# Patient Record
Sex: Female | Born: 1960
Health system: Southern US, Community
[De-identification: ages and names within clinical notes are randomized; demographics above are authoritative.]

## PROBLEM LIST (undated history)

## (undated) DIAGNOSIS — K219 Gastro-esophageal reflux disease without esophagitis: Secondary | ICD-10-CM

## (undated) DIAGNOSIS — R911 Solitary pulmonary nodule: Secondary | ICD-10-CM

## (undated) DIAGNOSIS — G4730 Sleep apnea, unspecified: Secondary | ICD-10-CM

## (undated) DIAGNOSIS — R112 Nausea with vomiting, unspecified: Secondary | ICD-10-CM

## (undated) DIAGNOSIS — T8859XA Other complications of anesthesia, initial encounter: Secondary | ICD-10-CM

## (undated) DIAGNOSIS — K5790 Diverticulosis of intestine, part unspecified, without perforation or abscess without bleeding: Secondary | ICD-10-CM

## (undated) DIAGNOSIS — F419 Anxiety disorder, unspecified: Secondary | ICD-10-CM

## (undated) DIAGNOSIS — G4733 Obstructive sleep apnea (adult) (pediatric): Secondary | ICD-10-CM

## (undated) DIAGNOSIS — Z9889 Other specified postprocedural states: Secondary | ICD-10-CM

## (undated) DIAGNOSIS — M1990 Unspecified osteoarthritis, unspecified site: Secondary | ICD-10-CM

## (undated) DIAGNOSIS — D126 Benign neoplasm of colon, unspecified: Secondary | ICD-10-CM

## (undated) DIAGNOSIS — I493 Ventricular premature depolarization: Secondary | ICD-10-CM

## (undated) DIAGNOSIS — T4145XA Adverse effect of unspecified anesthetic, initial encounter: Secondary | ICD-10-CM

## (undated) DIAGNOSIS — K589 Irritable bowel syndrome without diarrhea: Secondary | ICD-10-CM

## (undated) DIAGNOSIS — D509 Iron deficiency anemia, unspecified: Secondary | ICD-10-CM

## (undated) DIAGNOSIS — F40240 Claustrophobia: Secondary | ICD-10-CM

## (undated) HISTORY — DX: Iron deficiency anemia, unspecified: D50.9

## (undated) HISTORY — DX: Other complications of anesthesia, initial encounter: T88.59XA

## (undated) HISTORY — DX: Benign neoplasm of colon, unspecified: D12.6

## (undated) HISTORY — DX: Irritable bowel syndrome, unspecified: K58.9

## (undated) HISTORY — DX: Ventricular premature depolarization: I49.3

## (undated) HISTORY — DX: Adverse effect of unspecified anesthetic, initial encounter: T41.45XA

## (undated) HISTORY — DX: Diverticulosis of intestine, part unspecified, without perforation or abscess without bleeding: K57.90

## (undated) HISTORY — DX: Anxiety disorder, unspecified: F41.9

## (undated) HISTORY — DX: Nausea with vomiting, unspecified: R11.2

## (undated) HISTORY — DX: Sleep apnea, unspecified: G47.30

## (undated) HISTORY — DX: Claustrophobia: F40.240

## (undated) HISTORY — DX: Gastro-esophageal reflux disease without esophagitis: K21.9

## (undated) HISTORY — DX: Solitary pulmonary nodule: R91.1

## (undated) HISTORY — DX: Other specified postprocedural states: Z98.890

## (undated) HISTORY — PX: TONSILLECTOMY: SUR1361

## (undated) HISTORY — DX: Obstructive sleep apnea (adult) (pediatric): G47.33

## (undated) HISTORY — PX: NASAL SEPTUM SURGERY: SHX37

---

## 1997-11-09 ENCOUNTER — Ambulatory Visit (HOSPITAL_COMMUNITY): Admission: RE | Admit: 1997-11-09 | Discharge: 1997-11-09 | Payer: Self-pay

## 1998-03-18 ENCOUNTER — Other Ambulatory Visit: Admission: RE | Admit: 1998-03-18 | Discharge: 1998-03-18 | Payer: Self-pay | Admitting: *Deleted

## 1999-07-31 ENCOUNTER — Other Ambulatory Visit: Admission: RE | Admit: 1999-07-31 | Discharge: 1999-07-31 | Payer: Self-pay | Admitting: Obstetrics and Gynecology

## 2000-11-10 ENCOUNTER — Other Ambulatory Visit: Admission: RE | Admit: 2000-11-10 | Discharge: 2000-11-10 | Payer: Self-pay | Admitting: Obstetrics and Gynecology

## 2001-11-11 ENCOUNTER — Other Ambulatory Visit: Admission: RE | Admit: 2001-11-11 | Discharge: 2001-11-11 | Payer: Self-pay | Admitting: Obstetrics and Gynecology

## 2002-05-23 ENCOUNTER — Encounter: Payer: Self-pay | Admitting: Obstetrics and Gynecology

## 2002-05-23 ENCOUNTER — Ambulatory Visit (HOSPITAL_COMMUNITY): Admission: RE | Admit: 2002-05-23 | Discharge: 2002-05-23 | Payer: Self-pay | Admitting: Obstetrics and Gynecology

## 2002-11-28 ENCOUNTER — Other Ambulatory Visit: Admission: RE | Admit: 2002-11-28 | Discharge: 2002-11-28 | Payer: Self-pay | Admitting: Obstetrics and Gynecology

## 2003-06-25 ENCOUNTER — Encounter: Admission: RE | Admit: 2003-06-25 | Discharge: 2003-06-25 | Payer: Self-pay | Admitting: Obstetrics and Gynecology

## 2003-06-28 ENCOUNTER — Encounter: Admission: RE | Admit: 2003-06-28 | Discharge: 2003-06-28 | Payer: Self-pay | Admitting: Obstetrics and Gynecology

## 2003-10-07 ENCOUNTER — Emergency Department (HOSPITAL_COMMUNITY): Admission: EM | Admit: 2003-10-07 | Discharge: 2003-10-07 | Payer: Self-pay | Admitting: Emergency Medicine

## 2003-11-23 ENCOUNTER — Other Ambulatory Visit: Admission: RE | Admit: 2003-11-23 | Discharge: 2003-11-23 | Payer: Self-pay | Admitting: Obstetrics and Gynecology

## 2003-12-01 ENCOUNTER — Emergency Department (HOSPITAL_COMMUNITY): Admission: EM | Admit: 2003-12-01 | Discharge: 2003-12-02 | Payer: Self-pay | Admitting: Emergency Medicine

## 2003-12-03 ENCOUNTER — Ambulatory Visit: Payer: Self-pay | Admitting: Internal Medicine

## 2004-01-13 HISTORY — PX: ANKLE SURGERY: SHX546

## 2004-03-22 ENCOUNTER — Encounter
Admission: RE | Admit: 2004-03-22 | Discharge: 2004-03-22 | Payer: Self-pay | Admitting: Physical Medicine and Rehabilitation

## 2004-05-09 ENCOUNTER — Ambulatory Visit: Payer: Self-pay | Admitting: Family Medicine

## 2004-07-03 ENCOUNTER — Observation Stay (HOSPITAL_COMMUNITY): Admission: RE | Admit: 2004-07-03 | Discharge: 2004-07-04 | Payer: Self-pay | Admitting: Orthopedic Surgery

## 2004-08-12 ENCOUNTER — Ambulatory Visit (HOSPITAL_COMMUNITY): Admission: RE | Admit: 2004-08-12 | Discharge: 2004-08-12 | Payer: Self-pay | Admitting: Obstetrics and Gynecology

## 2004-10-14 ENCOUNTER — Ambulatory Visit: Payer: Self-pay | Admitting: Gastroenterology

## 2005-01-14 ENCOUNTER — Other Ambulatory Visit: Admission: RE | Admit: 2005-01-14 | Discharge: 2005-01-14 | Payer: Self-pay | Admitting: Obstetrics and Gynecology

## 2005-02-12 DIAGNOSIS — G4733 Obstructive sleep apnea (adult) (pediatric): Secondary | ICD-10-CM

## 2005-02-12 HISTORY — DX: Obstructive sleep apnea (adult) (pediatric): G47.33

## 2005-02-20 ENCOUNTER — Ambulatory Visit (HOSPITAL_BASED_OUTPATIENT_CLINIC_OR_DEPARTMENT_OTHER): Admission: RE | Admit: 2005-02-20 | Discharge: 2005-02-20 | Payer: Self-pay | Admitting: Otolaryngology

## 2005-03-01 ENCOUNTER — Ambulatory Visit: Payer: Self-pay | Admitting: Internal Medicine

## 2005-07-10 ENCOUNTER — Encounter (INDEPENDENT_AMBULATORY_CARE_PROVIDER_SITE_OTHER): Payer: Self-pay | Admitting: Specialist

## 2005-07-10 ENCOUNTER — Ambulatory Visit (HOSPITAL_COMMUNITY): Admission: RE | Admit: 2005-07-10 | Discharge: 2005-07-10 | Payer: Self-pay | Admitting: Obstetrics and Gynecology

## 2005-08-31 ENCOUNTER — Ambulatory Visit (HOSPITAL_COMMUNITY): Admission: RE | Admit: 2005-08-31 | Discharge: 2005-08-31 | Payer: Self-pay | Admitting: Obstetrics and Gynecology

## 2005-11-02 ENCOUNTER — Ambulatory Visit: Payer: Self-pay | Admitting: Gastroenterology

## 2005-11-04 ENCOUNTER — Encounter: Admission: RE | Admit: 2005-11-04 | Discharge: 2005-11-04 | Payer: Self-pay | Admitting: Otolaryngology

## 2005-11-13 ENCOUNTER — Ambulatory Visit: Payer: Self-pay | Admitting: Internal Medicine

## 2005-11-18 ENCOUNTER — Ambulatory Visit (HOSPITAL_COMMUNITY): Admission: RE | Admit: 2005-11-18 | Discharge: 2005-11-18 | Payer: Self-pay | Admitting: Internal Medicine

## 2005-11-19 ENCOUNTER — Ambulatory Visit (HOSPITAL_COMMUNITY): Admission: RE | Admit: 2005-11-19 | Discharge: 2005-11-19 | Payer: Self-pay | Admitting: Internal Medicine

## 2005-12-01 ENCOUNTER — Ambulatory Visit: Payer: Self-pay | Admitting: Internal Medicine

## 2005-12-07 ENCOUNTER — Ambulatory Visit: Payer: Self-pay | Admitting: Gastroenterology

## 2006-09-17 ENCOUNTER — Ambulatory Visit (HOSPITAL_COMMUNITY): Admission: RE | Admit: 2006-09-17 | Discharge: 2006-09-17 | Payer: Self-pay | Admitting: Obstetrics and Gynecology

## 2007-07-08 ENCOUNTER — Telehealth: Payer: Self-pay | Admitting: Gastroenterology

## 2007-08-29 ENCOUNTER — Ambulatory Visit: Payer: Self-pay | Admitting: Gastroenterology

## 2007-08-29 DIAGNOSIS — K591 Functional diarrhea: Secondary | ICD-10-CM | POA: Insufficient documentation

## 2007-08-29 DIAGNOSIS — R198 Other specified symptoms and signs involving the digestive system and abdomen: Secondary | ICD-10-CM | POA: Insufficient documentation

## 2007-08-29 DIAGNOSIS — K589 Irritable bowel syndrome without diarrhea: Secondary | ICD-10-CM | POA: Insufficient documentation

## 2007-08-29 DIAGNOSIS — K219 Gastro-esophageal reflux disease without esophagitis: Secondary | ICD-10-CM | POA: Insufficient documentation

## 2007-08-29 LAB — CONVERTED CEMR LAB
Basophils Relative: 0.9 % (ref 0.0–3.0)
CO2: 30 meq/L (ref 19–32)
Eosinophils Relative: 3.1 % (ref 0.0–5.0)
GFR calc non Af Amer: 96 mL/min
Glucose, Bld: 99 mg/dL (ref 70–99)
Hemoglobin: 12.4 g/dL (ref 12.0–15.0)
Lymphocytes Relative: 25.5 % (ref 12.0–46.0)
Monocytes Absolute: 0.6 10*3/uL (ref 0.1–1.0)
Neutro Abs: 5.4 10*3/uL (ref 1.4–7.7)
Neutrophils Relative %: 63.9 % (ref 43.0–77.0)
Potassium: 4 meq/L (ref 3.5–5.1)
RBC: 4.46 M/uL (ref 3.87–5.11)
RDW: 13.1 % (ref 11.5–14.6)
Sodium: 140 meq/L (ref 135–145)
TSH: 2.45 microintl units/mL (ref 0.35–5.50)
Total Bilirubin: 0.7 mg/dL (ref 0.3–1.2)
Total Protein: 7.3 g/dL (ref 6.0–8.3)
WBC: 8.6 10*3/uL (ref 4.5–10.5)

## 2007-08-31 LAB — CONVERTED CEMR LAB: Tissue Transglutaminase Ab, IgA: 0.4 units (ref <7)

## 2007-09-02 ENCOUNTER — Telehealth: Payer: Self-pay | Admitting: Gastroenterology

## 2007-11-10 ENCOUNTER — Ambulatory Visit (HOSPITAL_COMMUNITY): Admission: RE | Admit: 2007-11-10 | Discharge: 2007-11-10 | Payer: Self-pay | Admitting: Orthopedic Surgery

## 2008-02-08 ENCOUNTER — Ambulatory Visit: Payer: Self-pay | Admitting: Gastroenterology

## 2008-02-08 ENCOUNTER — Telehealth: Payer: Self-pay | Admitting: Gastroenterology

## 2008-02-08 DIAGNOSIS — K644 Residual hemorrhoidal skin tags: Secondary | ICD-10-CM | POA: Insufficient documentation

## 2008-02-08 DIAGNOSIS — R197 Diarrhea, unspecified: Secondary | ICD-10-CM | POA: Insufficient documentation

## 2008-02-08 DIAGNOSIS — R109 Unspecified abdominal pain: Secondary | ICD-10-CM | POA: Insufficient documentation

## 2008-02-09 DIAGNOSIS — R142 Eructation: Secondary | ICD-10-CM

## 2008-02-09 DIAGNOSIS — R143 Flatulence: Secondary | ICD-10-CM

## 2008-02-09 DIAGNOSIS — R141 Gas pain: Secondary | ICD-10-CM | POA: Insufficient documentation

## 2008-02-13 ENCOUNTER — Telehealth: Payer: Self-pay | Admitting: Gastroenterology

## 2008-02-15 LAB — CONVERTED CEMR LAB
AST: 20 units/L (ref 0–37)
Bilirubin, Direct: 0.1 mg/dL (ref 0.0–0.3)
Ferritin: 16.1 ng/mL (ref 10.0–291.0)
HCT: 36.2 % (ref 36.0–46.0)
Iron: 44 ug/dL (ref 42–145)
Lymphocytes Relative: 22.7 % (ref 12.0–46.0)
MCHC: 33.8 g/dL (ref 30.0–36.0)
Monocytes Absolute: 0.7 10*3/uL (ref 0.1–1.0)
Monocytes Relative: 6.1 % (ref 3.0–12.0)
Neutrophils Relative %: 69.3 % (ref 43.0–77.0)
Platelets: 340 10*3/uL (ref 150–400)
Total Protein: 6.9 g/dL (ref 6.0–8.3)
WBC: 11.1 10*3/uL — ABNORMAL HIGH (ref 4.5–10.5)

## 2008-02-27 ENCOUNTER — Encounter: Admission: RE | Admit: 2008-02-27 | Discharge: 2008-02-27 | Payer: Self-pay | Admitting: Obstetrics and Gynecology

## 2008-02-28 ENCOUNTER — Ambulatory Visit: Payer: Self-pay | Admitting: Gastroenterology

## 2008-02-28 DIAGNOSIS — D509 Iron deficiency anemia, unspecified: Secondary | ICD-10-CM | POA: Insufficient documentation

## 2008-02-29 ENCOUNTER — Encounter: Admission: RE | Admit: 2008-02-29 | Discharge: 2008-02-29 | Payer: Self-pay | Admitting: Obstetrics and Gynecology

## 2008-11-15 ENCOUNTER — Telehealth: Payer: Self-pay | Admitting: Gastroenterology

## 2009-12-30 ENCOUNTER — Encounter
Admission: RE | Admit: 2009-12-30 | Discharge: 2009-12-30 | Payer: Self-pay | Source: Home / Self Care | Attending: Otolaryngology | Admitting: Otolaryngology

## 2010-01-20 ENCOUNTER — Telehealth: Payer: Self-pay | Admitting: Gastroenterology

## 2010-01-24 ENCOUNTER — Ambulatory Visit (HOSPITAL_COMMUNITY): Admission: RE | Admit: 2010-01-24 | Payer: Self-pay | Source: Home / Self Care | Admitting: Otolaryngology

## 2010-02-02 ENCOUNTER — Encounter: Payer: Self-pay | Admitting: Orthopedic Surgery

## 2010-02-02 ENCOUNTER — Encounter: Payer: Self-pay | Admitting: Obstetrics and Gynecology

## 2010-02-02 ENCOUNTER — Encounter: Payer: Self-pay | Admitting: Physical Medicine and Rehabilitation

## 2010-02-13 NOTE — Progress Notes (Signed)
Summary: Medcation  Medications Added ANUSOL-HC 25 MG SUPP (HYDROCORTISONE ACETATE) Use 1 suppository rectally at bedtime       Phone Note Call from Patient Call back at Home Phone [phone]   Caller: Patient Call For: Dr. Russella Dar Reason for Call: Talk to Nurse Summary of Call: Pt wants to discuss having a medcation called in again to her to the  [facility] on [address] in [place], it is a medication she used to use and would like to have it again Initial call taken by: [place] Johnson,  January 20, 2010 11:43 AM  Follow-up for Phone Call        Pt states she would like a suppository called into her pharmacy for her hemorrhoids. Pt states she would like the Anusol HC suppositories. She states she has some Intermittant BRB rectal bleeding but denies, itching, pain, change in bowels. Pt states she can feel the hemorrhoid outside of the rectum when she has a BM but states it does not hurt or feel irritated. Please advise if you want her to have follow-up visit first or can the rx be sent in? Follow-up by: Christie Nottingham CMA Duncan Dull),  January 20, 2010 11:56 AM  Additional Follow-up for Phone Call Additional follow up Details #1::        Can try a 7 day Anusol [facility] prescription and if symptoms are not completely resolved she needs an REV. Additional Follow-up by: Meryl Dare MD Clementeen Graham,  January 20, 2010 12:16 PM    Additional Follow-up for Phone Call Additional follow up Details #2::    Left message for patient  to call back. Follow-up by: Christie Nottingham CMA Duncan Dull),  January 20, 2010 1:45 PM  Additional Follow-up for Phone Call Additional follow up Details #3:: Details for Additional Follow-up Action Taken: Left message for patient  to call back. Left message for patient  to call back Christie Nottingham CMA [facility])  January 22, 2010 11:54 AM  Called work number and left a message stating that we will send in a 7 day prescription for Anusol to her pharmacy and after 7 days if her symptoms  have not resolved, to call our office back to schedule a REV.  Additional Follow-up by: Christie Nottingham CMA Duncan Dull),  January 21, 2010 12:08 PM  New/Updated Medications: ANUSOL-HC 25 MG SUPP (HYDROCORTISONE ACETATE) Use 1 suppository rectally at bedtime Prescriptions: ANUSOL-HC 25 MG SUPP (HYDROCORTISONE ACETATE) Use 1 suppository rectally at bedtime  #7 days x 0   Entered by:   Christie Nottingham CMA (AAMA)   Authorized by:   Meryl Dare MD [facility]   Signed by:   Christie Nottingham CMA (AAMA) on 01/22/2010   Method used:   Faxed to ...       [facility] (retail)       [address]       [place], [place]    [place]       Ph: [phone]       Fax: [phone]   RxID:   [id number]

## 2010-02-27 ENCOUNTER — Encounter (HOSPITAL_COMMUNITY)
Admission: RE | Admit: 2010-02-27 | Discharge: 2010-02-27 | Disposition: A | Discharge status: Home / Self Care | Payer: Federal, State, Local not specified - PPO | Source: Ambulatory Visit | Attending: Otolaryngology | Admitting: Otolaryngology

## 2010-02-27 ENCOUNTER — Other Ambulatory Visit (HOSPITAL_COMMUNITY): Payer: Self-pay

## 2010-02-27 DIAGNOSIS — Z01812 Encounter for preprocedural laboratory examination: Secondary | ICD-10-CM | POA: Insufficient documentation

## 2010-02-27 LAB — CBC
HCT: 40.1 % (ref 36.0–46.0)
Hemoglobin: 12.9 g/dL (ref 12.0–15.0)
MCHC: 32.2 g/dL (ref 30.0–36.0)
Platelets: 398 10*3/uL (ref 150–400)
RDW: 13.4 % (ref 11.5–15.5)

## 2010-02-27 LAB — SURGICAL PCR SCREEN: Staphylococcus aureus: POSITIVE — AB

## 2010-02-28 ENCOUNTER — Observation Stay (HOSPITAL_COMMUNITY)
Admission: RE | Admit: 2010-02-28 | Discharge: 2010-02-28 | Disposition: A | Discharge status: Home / Self Care | Payer: Federal, State, Local not specified - PPO | Source: Ambulatory Visit | Attending: Otolaryngology | Admitting: Otolaryngology

## 2010-02-28 DIAGNOSIS — G4733 Obstructive sleep apnea (adult) (pediatric): Secondary | ICD-10-CM | POA: Insufficient documentation

## 2010-02-28 DIAGNOSIS — J342 Deviated nasal septum: Principal | ICD-10-CM | POA: Insufficient documentation

## 2010-02-28 DIAGNOSIS — J45909 Unspecified asthma, uncomplicated: Secondary | ICD-10-CM | POA: Insufficient documentation

## 2010-02-28 DIAGNOSIS — J343 Hypertrophy of nasal turbinates: Secondary | ICD-10-CM | POA: Insufficient documentation

## 2010-02-28 DIAGNOSIS — Z01812 Encounter for preprocedural laboratory examination: Secondary | ICD-10-CM | POA: Insufficient documentation

## 2010-02-28 NOTE — Op Note (Signed)
NAME:  [name] Horn, [name] Horn             ACCOUNT NO.:  [id number]  MEDICAL RECORD NO.:  [id number]           PATIENT TYPE:  I  LOCATION:  2550                         FACILITY:  [facility]  PHYSICIAN:  Kinnie Scales. Annalee Genta, M.D.DATE OF BIRTH:  Jun 19, 1960  DATE OF PROCEDURE:  02/28/2010 DATE OF DISCHARGE:                              OPERATIVE REPORT   PREOPERATIVE DIAGNOSES: 1. Deviated nasal septum with nasal airway obstruction. 2. Inferior turbinate hypertrophy. 3. Obstructive sleep apnea.  POSTOPERATIVE DIAGNOSES: 1. Deviated nasal septum with nasal airway obstruction. 2. Inferior turbinate hypertrophy. 3. Obstructive sleep apnea.  INDICATIONS FOR SURGERY: 1. Deviated nasal septum with nasal airway obstruction. 2. Inferior turbinate hypertrophy. 3. Obstructive sleep apnea.  SURGICAL PROCEDURES: 1. Septoplasty. 2. Bilateral inferior turbinate reduction.  SURGEON:  Kinnie Scales. Annalee Genta, MD  ANESTHESIA:  General endotracheal.  COMPLICATIONS:  None.  BLOOD LOSS:  Less than 50 mL.  The patient was transferred to the operating room to the recovery room in stable condition.  BRIEF HISTORY:  The patient is a 50 year old female referred to our office with a history of progressive symptoms of nasal airway obstruction and mild obstructive sleep apnea.  Sleep study was performed which showed mild levels of apnea.  The patient was intolerant to CPAP because of nasal airway obstruction.  Given her history, examination, and findings, I recommended that we consider her for nasal septoplasty and inferior turbinate reduction.  The risks, benefits, and possible complications of the procedure were discussed in detail with the patient and her family.  They understood and concurred to our plan for surgery which is scheduled as an outpatient under general anesthesia with overnight observation at [facility].  PROCEDURE:  The patient was brought to the operating room and placed in supine  position on the operating table.  General endotracheal anesthesia was established without difficulty.  When the patient was adequately anesthetized, she was injected with a total of 8 mL of 1% lidocaine 1:100,000 solution of epinephrine which was injected in a submucosal fashion on the nasal septum and inferior turbinates bilaterally.  The patient's nose was then packed with Afrin-soaked cottonoid pledgets and left in place for approximately 10 minutes for vasoconstriction hemostasis.  She was positioned on the operating table and prepped and draped in a sterile fashion.  The patient's surgical procedure was begun with a right anterior hemitransfixion incision which was carried through the mucosa and underlying submucosa and a mucoperichondrial flap was elevated from anterior to posterior on the patient's right-hand side.  The cartilaginous septum was crossed at the midline.  Mucoperiosteal flap was elevated on the left.  Severe nasal septal deviation with bony septal spurring was noted.  Midseptal cartilage was removed and later morselized and returned to the mucoperichondrial pocket posteriorly. The patient had severely deviated septal bone and cartilage which were mobilized and resected bringing the nasal septum to a good midline position.  The septal cartilage returned to the mucoperichondrial pocket and 4-0 gut suture on a Keith needle was then used to reapproximate mucosal flaps.  At the occlusion of the procedure, bilateral Doyle nasal septal splints were placed after the application of Bactroban  ointment and sutured into position with a 3-0 Ethilon suture.  Inferior turbinate reduction was then performed with cautery set at 12 watts.  Two submucosal passes were made in each inferior turbinate. When the turbinates were adequately cauterized, an anterior incision was created overlying soft tissue and mucosa was elevated.  A small amount of turbinate bone was resected.  Turbinates  were then outfractured creating more patent nasal cavity.  There was no bleeding.  Nasal cavity and nasopharynx were irrigated and suctioned.  Orogastric tube was passed.  Stomach contents were aspirated.  The patient was awakened from anesthetic, extubated, and transferred from the operating room to the recovery room in stable condition.  There were no complications and blood loss was less than 50 mL.          ______________________________ Kinnie Scales. Annalee Genta, M.D.     DLS/MEDQ  D:  57/84/6962  T:  02/28/2010  Job:  [id number]  Electronically Signed by Osborn Coho M.D. on 02/28/2010 04:52:20 PM

## 2010-04-28 ENCOUNTER — Ambulatory Visit (INDEPENDENT_AMBULATORY_CARE_PROVIDER_SITE_OTHER): Payer: Federal, State, Local not specified - PPO | Admitting: Gastroenterology

## 2010-04-28 ENCOUNTER — Encounter: Payer: Self-pay | Admitting: Gastroenterology

## 2010-04-28 VITALS — BP 120/86 | HR 80 | Ht 63.0 in | Wt 181.6 lb

## 2010-04-28 DIAGNOSIS — Z8371 Family history of colonic polyps: Secondary | ICD-10-CM

## 2010-04-28 DIAGNOSIS — R1011 Right upper quadrant pain: Secondary | ICD-10-CM

## 2010-04-28 DIAGNOSIS — K648 Other hemorrhoids: Secondary | ICD-10-CM

## 2010-04-28 DIAGNOSIS — K589 Irritable bowel syndrome without diarrhea: Secondary | ICD-10-CM

## 2010-04-28 MED ORDER — PEG-KCL-NACL-NASULF-NA ASC-C 100 G PO SOLR: 1.0000 | Freq: Once | ORAL | Status: AC

## 2010-04-28 NOTE — Progress Notes (Signed)
History of Present Illness: This is a 50 year old female who complains of mild RUQ pain typically after a high fat meal for a few months. She describes a hot sensation with dull pain that resolves after minutes to hours. She has ongoing prolapsing of presumed hemorrhoids, needing manual reduction at times. One episode of small amounts of hematochezia. She has intermittent reflux symptoms and takes Nexium as needed. She no longer has diarrhea type IBS symptoms and she does not take LevBid. No change in stool caliber, melena or weight loss.  Current Medications, Allergies, Past Medical History, Past Surgical History, Family History and Social History were reviewed in [employer] record.  Physical Exam: General: Well developed , well nourished, no acute distress Head: Normocephalic and atraumatic Eyes:  sclerae anicteric, EOMI Ears: Normal auditory acuity Mouth: No deformity or lesions Lungs: Clear throughout to auscultation Heart: Regular rate and rhythm; no murmurs, rubs or bruits Abdomen: Soft, non tender and non distended. No masses, hepatosplenomegaly or hernias noted. Normal Bowel sounds Rectal: Small ext hem tag. No internal lesions. Heme negative brown stool. Musculoskeletal: Symmetrical with no gross deformities  Pulses:  Normal pulses noted Extremities: No clubbing, cyanosis, edema or deformities noted Neurological: Alert oriented x 4, grossly nonfocal Psychological:  Alert and cooperative. Anxious.  Assessment and Recommendations:  1. RUQ pain. Reduce fatty food intake. Abd [place] scheduled by Dr. Waynard Edwards for 4/17. If [place] negative and fatty food avoidance does not resolve the symptoms, would resume LevBid for possible IBS related pain.  2. Hemorrhoids. Cannot R/O a prolapsing polyp or other lesion but I suspect this internal hemorrhoids. Colonoscopy. See problem #3.   3. Family history of colon polyps in her mother and colon cancer in her MGM. The risks, benefits,  and alternatives to colonoscopy with possible biopsy and possible polypectomy and possible destruction of internal hemorrhoids were discussed with the patient and they consent to proceed.   4. GERD. Nexium prn.  5. Anxiety.

## 2010-04-28 NOTE — Patient Instructions (Signed)
You have been scheduled for a Colonoscopy. Separate instructions given to patient.  Your preperation kit has been sent to the pharmacy.

## 2010-05-13 ENCOUNTER — Encounter: Payer: Self-pay | Admitting: Gastroenterology

## 2010-05-13 ENCOUNTER — Telehealth: Payer: Self-pay | Admitting: Gastroenterology

## 2010-05-13 DIAGNOSIS — D126 Benign neoplasm of colon, unspecified: Secondary | ICD-10-CM

## 2010-05-13 HISTORY — DX: Benign neoplasm of colon, unspecified: D12.6

## 2010-05-13 NOTE — Telephone Encounter (Signed)
Pt states she was given a jug container with powder packets and didn't know why her instructions did not match her prescription. She stayed on the phone with me while we called the pharmacist and they told me to tell the patient to bring in the wrong containers she was given and they would exchange the prescription for Movi prep. [facility] does state they have Movi prep in stock so she can easily swing by after she leaves work to pick up the prep. Told pt to call us back if she has any problems picking up the right prep.

## 2010-05-14 ENCOUNTER — Ambulatory Visit (AMBULATORY_SURGERY_CENTER): Payer: Federal, State, Local not specified - PPO | Admitting: Gastroenterology

## 2010-05-14 ENCOUNTER — Encounter: Payer: Self-pay | Admitting: Gastroenterology

## 2010-05-14 VITALS — BP 166/87 | HR 114 | Temp 99.1°F | Resp 24 | Ht 63.0 in | Wt 179.0 lb

## 2010-05-14 DIAGNOSIS — D126 Benign neoplasm of colon, unspecified: Secondary | ICD-10-CM

## 2010-05-14 DIAGNOSIS — Z1211 Encounter for screening for malignant neoplasm of colon: Secondary | ICD-10-CM

## 2010-05-14 DIAGNOSIS — Z800 Family history of malignant neoplasm of digestive organs: Secondary | ICD-10-CM

## 2010-05-14 DIAGNOSIS — Z83719 Family history of colon polyps, unspecified: Secondary | ICD-10-CM

## 2010-05-14 DIAGNOSIS — K5730 Diverticulosis of large intestine without perforation or abscess without bleeding: Secondary | ICD-10-CM

## 2010-05-14 DIAGNOSIS — Z8371 Family history of colonic polyps: Secondary | ICD-10-CM

## 2010-05-14 MED ORDER — SODIUM CHLORIDE 0.9 % IV SOLN: 500.0000 mL | INTRAVENOUS | Status: DC

## 2010-05-14 NOTE — Patient Instructions (Signed)
Resume all medications. Information given on polyps, diverticulosis, high fiber diet. 

## 2010-05-15 ENCOUNTER — Telehealth: Payer: Self-pay

## 2010-05-15 ENCOUNTER — Telehealth: Payer: Self-pay | Admitting: Gastroenterology

## 2010-05-15 NOTE — Telephone Encounter (Signed)
1610 I called pt. Back, she questioned how she would receive the biopsy results.  I advised pt 1 6mm polyp was removed from the ascending colon ans it was sent to pathology to be tested.  Dr. Russella Dar will send her a letter in 2- 3 weeks with the results and any further recommendations.  Pt states she understands and that she did well after the colonoscopy at home.  No complaints.  No further questions at this time.

## 2010-05-15 NOTE — Telephone Encounter (Signed)
I called the pt's hm # and her cell #.  I reached voice mail on both, but no ID no message left.  MAW

## 2010-05-19 ENCOUNTER — Encounter: Payer: Self-pay | Admitting: Gastroenterology

## 2010-05-27 NOTE — Op Note (Signed)
NAME:  [name] Horn, [name] Horn             ACCOUNT NO.:  [id number]   MEDICAL RECORD NO.:  [id number]          PATIENT TYPE:  AMB   LOCATION:  SDS                          FACILITY:  [facility]   PHYSICIAN:  Vania Rea. Supple, M.D.  DATE OF BIRTH:  1960/07/14   DATE OF PROCEDURE:  11/10/2007  DATE OF DISCHARGE:                               OPERATIVE REPORT   PREOPERATIVE DIAGNOSIS:  Painful retained hardware, right ankle.   POSTOPERATIVE DIAGNOSIS:  Painful retained hardware, right ankle.   PROCEDURE:  Removal of a retained hardware, right ankle.   SURGEON:  Vania Rea. Supple, MD   ASSISTANT:  Lucita Lora. Shuford, PA-C   ANESTHESIA:  General endotracheal as well as local anesthetic.   TOURNIQUET TIME:  Less than 30 minutes.   ESTIMATED BLOOD LOSS:  Minimal.   DRAINS:  None.   HISTORY:  [name] is a 50 year old female who has had a previous  ORIF of her right ankle pilon fracture that I performed a number of  years ago.  She has gone on to clinical and radiographic healing with  continued to have discomfort related to her retained hardware.  Due to  her ongoing pain and functional limitation, she is brought to the  operating at this time for planned hardware removal from the right  ankle.   Preoperatively, I had counseled [name] on treatment options as  well as risks versus benefits thereof.  Possible surgical complications  bleeding, infection, neurovascular injury as well as persistent pain  reviewed.  She understands and accepts and agrees with our planned  procedure.   PROCEDURE IN DETAIL:  After undergoing routine preop evaluation, the  patient received prophylactic antibiotics.  Brought to the operating  room, placed supine on the operating table and underwent smooth  induction of general endotracheal anesthesia.  Turned by the right thigh  and right leg was sterilely prepped and draped in standard fashion.  Leg  was exsanguinated with the tourniquet inflated at 350 mmHg.   Made a  lateral approach to the distal fibula through her previous incision  approximately 7 cm in length.  Skin flaps were elevated.  Dissection  carried deeply down to the subcutaneous border of the distal fibula,  which immediately allowed visualization of the plate.  The peroneal  tendons were reflected posteriorly and the plate was then exposed in  subperiosteal fashion.  The screws were then removed without difficulty  as was the plate.  We then turned our direction medially where there had  been a single 4.5 screw placed through a stab wound and the screw head  was palpated.  A 1-cm skin incision was made with a knife and deep  dissection carried bluntly with a hemostat and the tip of the screw was  then visualized and a screwdriver introduced and the screw was removed  without difficulty.  Wound was then irrigated.  The lateral incision was  closed with 2-0 Vicryl for subcu layer and intracuticular 3-0 Monocryl  for the skin followed by Steri-Strips.  The medial incision was closed  with Monocryl and Steri-Strips.  A bulky dry dressing  was wrapped  about the ankle.  Prior to dressings, however, we did instill 0.5%  Marcaine with epinephrine into edges of the skin incisions.  A bulky dry  dressing was applied.  Tourniquet was let down.  The patient was then  awakened, extubated, and taken to the recovery room in stable condition.      Vania Rea. Supple, M.D.  Electronically Signed     KMS/MEDQ  D:  11/10/2007  T:  11/10/2007  Job:  [id number]

## 2010-05-29 ENCOUNTER — Telehealth: Payer: Self-pay | Admitting: Gastroenterology

## 2010-05-29 NOTE — Telephone Encounter (Signed)
All questions answered she will call back for any questions or concerns

## 2010-05-29 NOTE — Telephone Encounter (Signed)
Left message for patient to call back  

## 2010-05-30 NOTE — Procedures (Signed)
NAME:  [name] Horn, STERLING NO.:  [id number]   MEDICAL RECORD NO.:  [id number]          PATIENT TYPE:  OUT   LOCATION:  SLEEP CENTER                 FACILITY:  [facility]   PHYSICIAN:  Clinton D. Maple Hudson, M.D. DATE OF BIRTH:  1960/12/23   DATE OF STUDY:  02/20/2005                              NOCTURNAL POLYSOMNOGRAM   REFERRING PHYSICIAN:  Dr. Osborn Coho   DATE OF STUDY:  February 20, 2005   INDICATION FOR STUDY:  Hypersomnia with sleep apnea. Epworth sleepiness  score 6/24, BMI 30.9, weight 175 pounds. Home medication: Zyrtec.   SLEEP ARCHITECTURE:  Total sleep time 383 minutes with sleep efficiency 84%.  Stage I was 4%, stage II 50%, stages III and IV 17%, REM 28% of total sleep  time. Sleep latency 30 minutes, REM latency 97 minutes, awake after sleep  onset 44 minutes, arousal index increased at 45. No bedtime medication other  than Zyrtec was reported.   RESPIRATORY DATA:  Apnea-hypopnea index (AHI, RDI) 7.7 obstructive events  per hour indicating mild obstructive sleep apnea/hypopnea syndrome. This  included 9 obstructive apneas and 40 hypopneas. Most events were recorded  while sleeping on her back (AHI 18.9 per hour) with strong positional  association recognized. REM AHI 25 per hour. There were insufficient events  to permit CPAP titration by protocol on this study night and the diagnostic  study was performed.   OXYGEN DATA:  Moderate to severe snoring with oxygen desaturation to a nadir  of 82%. Mean oxygen saturation through the study was 95% on room air.   CARDIAC DATA:  Normal sinus rhythm.   MOVEMENT/PARASOMNIA:  Occasional leg jerk, insignificant.   IMPRESSION/RECOMMENDATION:  1.  Mild obstructive sleep apnea/hypopnea syndrome, apnea-hypopnea index 7.7      per hour, positional events mostly associated with supine sleep      position. Loud snoring with oxygen desaturation to a nadir of 82%.  2.  This score is below the range usually considered  for continuous positive      airway pressure titration unless more conservative measures are      insufficient. Evaluate for alternative therapies as appropriate.      Clinton D. Maple Hudson, M.D.  Diplomate, Biomedical engineer of Sleep Medicine  Electronically Signed     CDY/MEDQ  D:  03/01/2005 09:19:19  T:  03/01/2005 86:57:84  Job:  [id number]

## 2010-05-30 NOTE — Assessment & Plan Note (Signed)
[facility]                               PULMONARY OFFICE NOTE   NAME:[name] Horn, [name] Horn                    MRN:          [id number]  DATE:11/13/2005                            DOB:          1960/10/12    PROBLEM:  Pulmonary consultation at the kind request of Dr. Annalee Genta for  this 50 year old woman with a complaint of wheezing.   HISTORY:  She says she has not been specifically diagnosed with asthma, but  has a history of intermittent wheezing, head, and chest congestion going  back probably a number of years.  Something like 8-10 years ago, she had  allergy skin testing done by Dr. Sunnyslope Callas and remembers positive reactions for  animal danders, a variety of pollens, grass, and some foods.  Apparently  there were personality issues and also she found the logistics difficult and  did not follow through with allergy vaccine at that time.  She notices  wheezing specifically if she is exposed to cats or to grass.  An Albuterol  inhaler has helped in the past.  There has been a question of esophageal  reflux and she had a barium swallow and is working with Dr. Russella Dar.  She has  elevated the head of her bed and is pending upper endoscopy.  In the past  month, she has had what she recognized as a definite cold, starting with  head congestion moving to her throat.  She feels she always has mucous in  her throat with no complaints about her nose.  Dr. Annalee Genta had done  rhinoscopy and told her larynx was irritated.  She finds herself frequently  clearing her throat and says she feels her complaints and the wheeze  generate at the throat level.  She does not complain when asked about either  her sinuses or her chest as sources of these discomforts.   MEDICATIONS:  Multi-vitamins, Zegerid, Tylenol PM, Zyrtec.   ALLERGIES:  Drug intolerance of aspirin.   REVIEW OF SYSTEMS:  40 pounds weight gain that began when she stopped doing  regular exercise some years  ago.  Before that, she states she was quite  slender.  Exertional dyspnea blamed on weight gain only.  Occasional wheezes  wakes her.  She does not recognize waking at night, choking or strangling or  having had obvious reflux.  No significant headache, chest pain, or  palpitations.  No urticaria or other rash, adenopathy, hot joints, or edema.   PAST HISTORY:  Allergic rhinitis, question of sleep apnea with sleep study  done.  She has been fitted with CPAP but is not using it saying that she  cannot get used to it.  We will try to get that study report.  She had a  D&C in the spring of this year, surgical repair of right ankle fracture last  year, two C-sections.  She says she is not sure about any intolerance to  contrast dye, iodine, or to Afrin.   SOCIAL HISTORY:  She has never smoked.  1-2 beers a week.  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   FAMILY HISTORY:  Grandfather died young of tuberculosis.  Maternal  grandmother had colon cancer.  Father died with pneumonia while in a rehab  program.  Brother recently diagnosed with sarcoid.   OBJECTIVE:  VITAL SIGNS:  Weight 186 pounds, blood pressure 116/72, pulse  regular at 77, room air saturation 100%.  GENERAL:  This is an overweight, pleasant alert woman.  SKIN:  No rash.  ADENOPATHY:  None found.  HEAD AND NECK:  Periorbital edema, mild hoarseness, pharynx is not red, I do  not see evidence of post nasal drainage and there is no stridor or  thyromegaly.  No neck vein distention. The nasal septum is somewhat deviated  but not obstructing.  LUNGS:  Clear, I do not hear a wheeze.  HEART:  Sounds are regular without murmur or gallop.  ABDOMEN:  I do not feel liver or spleen.  EXTREMITIES:  Without cyanosis, clubbing, and edema.   IMPRESSION:  1. Asthma, probably mild and intermittent.  2. Allergic rhinitis, positive skin testing in the past.   3. Obstructive sleep apnea not successful at this point with CPAP.  I am      not clear where that stands.   PLAN:  1. We will get her sleep study report.  2. I have emphasized weight loss, good sleep hygiene, and reinforce Dr.      Ardell Isaacs anti-reflux instructions.  3. We are scheduling complete pulmonary function tests to include a      methacholine inhalation challenge.  4. She will return after those studies are complete.   I appreciate the chance to see her.     Clinton D. Maple Hudson, MD, Tonny Bollman, FACP  Electronically Signed    CDY/MedQ  DD: 11/14/2005  DT: 11/14/2005  Job #: [id number]   cc:   Onalee Hua L. Annalee Genta, M.D.  Lavonda Jumbo, M.D.

## 2010-05-30 NOTE — H&P (Signed)
NAME:  [name] Horn, [name] Horn NO.:  [id number]   MEDICAL RECORD NO.:  [id number]           PATIENT TYPE:   LOCATION:                                FACILITY:  [facility]   PHYSICIAN:  Zenaida Niece, M.D.     DATE OF BIRTH:   DATE OF ADMISSION:  07/10/2005  DATE OF DISCHARGE:                                HISTORY & PHYSICAL   CHIEF COMPLAINT:  Possible endometrial polyp.   HISTORY OF PRESENT ILLNESS:  This is a 50 year old white female para 2-0-0-2  who was seen in January of this year for an annual examination.  At that  time she said that she had regular periods every month which were lasting  for seven days, but not with significantly heavy flow.  In April of this  year she developed left pelvic pain.  This was intermittent, random, several  times a day, and lasted for approximately 10 seconds.  She had no  dyspareunia and no urinary or bowel symptoms.  The pain was mostly on her  left and occasionally on the right.  A pelvic ultrasound was performed on  April 10 which revealed a small, slightly irregular cyst of the right ovary  which I did not feel was responsible for her pain.  The tech also noted the  possibility of an endometrial polyp.  At that point I advised the patient to  just repeat the ultrasound in two to three months due to the slight  irregularity of the cyst.  However, she was very nervous about the fact that  this cyst was there and requested laparoscopic removal of the ovary.  She  did, however, then have an appointment at [facility] with the GYN  oncologists and they agreed with my assessment and did not feel that she  needed surgery.  She had a normal CA-125.  I then saw her back on May 24 for  a follow-up ultrasound with saline infusion to assess for a possible polyp.  At that time she has as normal sized uterus with possibly a slightly  thickened endometrium.  The right ovary is normal and the left ovary is not  seen.  With saline infusion there is  possibly an endometrial lesion in the  posterior mid fundus, but no definite polyp.  An endometrial biopsy was  performed which sounded to 8 cm, revealed moderate tissue and pathology  reveals proliferative endometrium and a fragment consistent with an  endometrial polyp.  Due to the fact that she has recently had some irregular  bleeding she wishes to proceed with hysteroscopy with removal of this polyp.  She also is considering having NovaSure endometrial ablation.   PAST OB HISTORY:  Significant for two cesarean sections at term.   PAST MEDICAL HISTORY:  Negative except for a fractured ankle in June of  2006.  She does have a history of eczema.   PAST SURGICAL HISTORY:  Tonsillectomy and cesarean section x2.   ALLERGIES:  Possibly to ASPIRIN.   CURRENT MEDICATIONS:  None.   SOCIAL HISTORY:  Patient is married and denies alcohol, tobacco, or drug  use.   FAMILY HISTORY:  Maternal grandmother has colon cancer.   Review of systems is otherwise negative.   PHYSICAL EXAMINATION:  VITAL SIGNS:  Weight is approximately 175 pounds,  blood pressure 130/80.  GENERAL:  This is a well-developed white female in no acute distress.  NECK:  Supple without lymphadenopathy or thyromegaly.  LUNGS:  Clear to auscultation.  HEART:  Regular rate and rhythm without murmur.  ABDOMEN:  Soft, nontender, nondistended with a well healed transverse scar.  EXTREMITIES:  No edema, nontender.  PELVIC:  External genitalia has no lesions.  On bimanual examination she has  a small plantar to anteverted uterus which is nontender.  She has no adnexal  masses and does not currently have tenderness.   ASSESSMENT:  Recent irregular bleeding with a possible endometrial polyp by  ultrasound and biopsy.  Patient wishes to proceed with hysteroscopy to  remove this polyp.  Risks of surgery have been discussed.  She has also been  offered and is considering having endometrial ablation done to decrease her  periods.   Again, all risks of surgery have been discussed.   PLAN:  Admit the patient on the day of surgery for a hysteroscopy with  dilation and curettage and possible resection.  She will let me know the day  of surgery if she wants to proceed with endometrial ablation as well.      Zenaida Niece, M.D.  Electronically Signed     TDM/MEDQ  D:  07/09/2005  T:  07/09/2005  Job:  [id number]

## 2010-05-30 NOTE — Op Note (Signed)
NAME:  [name] Horn, [name] Horn NO.:  [id number]   MEDICAL RECORD NO.:  [id number]          PATIENT TYPE:  AMB   LOCATION:  SDC                           FACILITY:  [facility]   PHYSICIAN:  Zenaida Niece, M.D.DATE OF BIRTH:  08-Jun-1960   DATE OF PROCEDURE:  07/10/2005  DATE OF DISCHARGE:                                 OPERATIVE REPORT   PREOPERATIVE DIAGNOSES:  Abnormal uterine bleeding.   POSTOPERATIVE DIAGNOSIS:  Abnormal uterine bleeding.   PROCEDURE:  Hysteroscopy with D&C.   SURGEON:  Zenaida Niece, M.D.   ANESTHESIA:  Monitored anesthesia care and paracervical block.   SPECIMENS:  Endometrial curettings sent to pathology.   ESTIMATED BLOOD LOSS:  Minimal.   COMPLICATIONS:  None.   FINDINGS:  She had an essentially normal endometrial cavity with abundant  tissue.  Small residual amount of tissue after curettage was attempted to be  resected, and a tiny bit of tissue was resected.  Fluid deficit through this  hysteroscope was approximately 66 mL.   PROCEDURE IN DETAIL:  The patient was taken to the operating room and placed  in the dorsal supine position.  She was given IV sedation and placed in  mobile stirrups.  Perineum and vagina were then prepped and draped in the  usual sterile fashion and bladder drained with a red rubber catheter.  A  Graves speculum was inserted into the vagina.  A 2 mL of 2% plain lidocaine  was infiltrated at 12 o'clock in the cervix, and this was grasped with a  single-tooth tenaculum.  Deep paracervical block was then performed with 16  mL of 2% lidocaine using 4 mL at 2, 4, 8 and 10 o'clock.  The uterus then  sounded to 9 cm, and the cervix was easily dilated to a size 19 dilator.  The observer hysteroscope was inserted, and good visualization was achieved.  Abundant tissue was noted and no specific lesions.  The hysteroscope was  removed, and sharp curettage was performed with return of a moderate amount  of tissue.  The  hysteroscope was reinserted, and there was still found to be  tissue anteriorly.  The hysteroscope was removed, and curettage was  performed again anteriorly.  The hysteroscope was again inserted, and almost  all of tissue was removed.  There was a small amount of tissue on the  posterior right side which appeared to be polypoid.  The small resectoscope  was put together with some difficulty and inserted.  It did not work very  smoothly, but a small amount of tissue was resected with cutting current  with a single loop.  The remainder of the uterus appeared free of  significant tissue.  The hysteroscope was removed.  The tenaculum was  removed from the anterior portion of the cervix and bleeding controlled with  pressure.  All instruments were then removed from the vagina.  The patient  tolerated the procedure well and was taken to the recovery room in stable  condition.  Counts were correct.      Zenaida Niece, M.D.  Electronically Signed  TDM/MEDQ  D:  07/10/2005  T:  07/10/2005  Job:  [id number]

## 2010-05-30 NOTE — Assessment & Plan Note (Signed)
[facility]                           GASTROENTEROLOGY OFFICE NOTE   NAME:Horn Horn VAZQUEZ                    MRN:          [id number]  DATE:11/02/2005                            DOB:          [birth year]    This is a return visit for possible LPR with GERD.  She has recently seen  Dr. Annalee Horn for symptoms of throat clearing and wheezing.  She has  occasional nocturnal symptoms.  She was advised to begin Zegerid 40 mg p.o.  q.d., which started about 10 days ago.  I last saw her in October of 2006  with similar symptoms.  She did not remain on medication and she did not  return for followup.  She has an appointment Dr. Fannie Horn next week to  evaluate wheezing, possible allergies and sleep apnea, on CPAP.  She would  like the appointment sooner.  She has no dysphagia, odynophagia or  heartburn.  She notes no weight loss, melena or hematochezia.  She does have  some occasional, very mild pain in her right upper quadrant that is not  clearly related to meals, bowel movements or any particular activity.  These  symptoms have been present for a few years.  She notes that they may be  slightly better since starting Zegerid 10 days ago.   CURRENT MEDICATIONS:  1. Zegerid 40 mg q.d. for 10 days.  2. Multivitamin q.d.  3. Zyrtec q.d. p.r.n.   MEDICATION ALLERGIES:  ASPIRIN.   EXAMINATION:  GENERAL:  In no acute distress.  VITAL SIGNS:  Weight 177 pounds.  Blood pressure is 100/70.  Pulse 68 and  regular.  HEENT:  Anicteric sclerae.  Oropharynx clear.  CHEST:  Clear to auscultation bilaterally.  CARDIAC:  Regular rate and rhythm without murmurs.  ABDOMEN:  Soft, non-tender and non-distended.  Normoactive bowel sounds.  No  palpable organomegaly, masses or hernias.   ASSESSMENT AND PLAN:  1. Presumed gastroesophageal reflux disease with lactate pyruvate ratio.      Increase Zegerid to 40 mg p.o. b.i.d. and begin strict anti-reflux      measures.   She will proceed with a barium esophagram already scheduled      by Dr. Annalee Horn.  May need upper endoscopy following this for further      evaluation.  In addition, her asthma symptoms may be related to      gastroesophageal reflux disease, or she may have allergies or other      causes, and this will be further evaluated by Dr. Fannie Horn.  We will      attempt to get a sooner appointment if Dr. Maple Horn has availability.      Begin strict anti-reflux measures with the use of 4 inch bed blocks.      Schedule upper endoscopy.  Risks, benefits and alternatives discussed      with the patient, and she consents to proceed.  This will be scheduled      electively.  I also discussed the possibility of dual-probe 24-hour pH      study performed off medication to further evaluate for possible  gastroesophageal reflux disease.  2. Family history of colon polyps and colon cancer.  Her mother has      adenomatous colon polyps in 36's, and she has a maternal grandmother      with colon cancer in her 51's.  Risks, benefits and alternatives to      colonoscopy with possible biopsy and possible polypectomy discussed      with the patient.  She consents to proceed.  This will be scheduled      electively at the time of her upper endoscopy.       [name] Lick. Russella Dar, MD, Clementeen Graham      MTS/MedQ  DD:  11/02/2005  DT:  11/03/2005  Job #:  [id number]   cc:   [name] Hua L. Horn Horn, M.D.

## 2010-05-30 NOTE — Assessment & Plan Note (Signed)
[facility]                               PULMONARY OFFICE NOTE   NAME:[name] Horn, [name] Horn                    MRN:          [id number]  DATE:12/01/2005                            DOB:          1960/07/01    PROBLEM:  1. Asthma.  2. Allergic rhinitis.  3. Obstructive sleep apnea, CPAP-intolerant.   HISTORY:  She had not picked up a Singulair sample.  Then called [place] asking  for an albuterol rescue inhaler, which we provided.  She says this has  helped quickly when she has had episodes of increased chest tightness and  shortness of breath.  She questions how much of it may be anxiety, but says  she is outdoors on windy days.  Occasional dull ache, left lateral posterior  ribs, which comes and goes.  She has a HEPA filter running in her room.  She  is not using CPAP.  She continues to want to strictly avoid any steroid  therapies.  Got a new puppy.  We discussed environmental precautions and  related issues.   MEDICATIONS:  1. Zegerid.  2. Tylenol PM.  3. Zyrtec.  4. Albuterol inhaler.   OBJECTIVE:  VITAL SIGNS:  Weight 179 pounds, blood pressure 102/60, pulse  regular at 79.  Room air saturation 98%.  HEART:  Sounds are regular and normal without murmur.  LUNGS:  Clear to P&A.  HEENT:  Conjunctivae and nasal mucosa are both clear.   Pulmonary function tests on November 19, 2005, showed normal spirometry  flows.  Her methacholine inhalation challenge test was abnormal, positive  for hyper-reactive airways which would support a suspicion of asthma.  Measured flow spirometries have been normal, without response to  bronchodilator.  Measured lung volumes and diffusion capacity have been  normal.   IMPRESSION:  1. Allergic rhinitis.  2. Mild intermittent asthma with diagnosis supported by her positive      methacholine inhalation challenge test and her subjective response to      albuterol.  3. Obstructive sleep apnea by history.  I do not have  full details, and      she does not recognize symptoms.   PLAN:  1. Chest x-ray.  2. Sample and trial of Singulair 10 mg.  3. Environmental precautions.  4. She will return for allergy skin testing.  5. Continue p.r.n. use of albuterol, as discussed.     Clinton D. Maple Hudson, MD, Tonny Bollman, FACP  Electronically Signed    CDY/MedQ  DD: 12/01/2005  DT: 12/02/2005  Job #: [id number]   cc:   Lavonda Jumbo, M.D.  Kinnie Scales. Annalee Genta, M.D.

## 2010-05-30 NOTE — Op Note (Signed)
NAME:  [name] Horn, [name] Horn NO.:  [id number]   MEDICAL RECORD NO.:  [id number]          PATIENT TYPE:  AMB   LOCATION:  DAY                          FACILITY:  [facility]   PHYSICIAN:  Vania Rea. Supple, M.D.  DATE OF BIRTH:  04/16/1960   DATE OF PROCEDURE:  07/03/2004  DATE OF DISCHARGE:                                 OPERATIVE REPORT   PREOPERATIVE DIAGNOSES:  Right ankle pilon fracture.   POSTOPERATIVE DIAGNOSES:  Right ankle pilon fracture.   PROCEDURE:  Open reduction and internal fixation of right ankle pilon  fracture including plating of the fibula and internal fixation of the distal  tibia.   SURGEON:  Vania Rea. Supple, M.D.   Threasa HeadsFrench Ana A. Shuford, P.A.-C.   ANESTHESIA:  General endotracheal.   TOURNIQUET TIME:  Less than 60 minutes.   ESTIMATED BLOOD LOSS:  Minimal.   DRAINS:  None.   HISTORY:  Ms. Shadle is a 50 year old female who slipped and fell injuring  her ankle approximately two weeks and on initial presentation to the office  the overall alignment of the fracture site was in relatively good position.  However on subsequent followup one week later, there had been displacement  at the fracture site with potential for displacement of the articular  segment. Due to the degree of displacement, it was discussed with Ms.  Robers and her husband surgical treatment options. The potential long-term  complications of surgical versus conservative management including potential  for malunion, nonunion, posttraumatic arthritis, failure of fixation,  infection, DVT, PE as well as persistence of pain were reviewed. She  understands and accepts and agrees with our plan for an ORIF.   DESCRIPTION OF PROCEDURE:  After undergoing routine preop evaluation, the  patient was brought to the operating room, placed supine on the operating  table and underwent smooth induction of general endotracheal anesthesia. She  received prophylactic antibiotics.  Tourniquet applied to the right thigh,  right leg was sterilely prepped and draped in standard fashion. The leg was  exsanguinated with the tourniquet inflated to 350 mmHg. A longitudinal 10 cm  incision was made over the distal fibula with sharp dissection carried  through the skin and subcutaneous tissue. The fascia was divided with the  perineal muscle and tendon reflected posteriorly and subperiosteal  dissection used to expose the posterolateral margin of the fibula from the  fracture site extending proximally and distally. The fracture was exposed,  irrigated and all __________ soft tissue and clot was removed. Under direct  visualization, an anatomic reduction was achieved. A seven hole 1/3 tubular  plate was then contoured to fit over the posterolateral margin of the fibula  and this was then fastened with standard AO technique with 3.5 cortical  screws proximally, 4.0 cancellous screws distally x2 and a lag screw across  the fracture site obliquely through the plate. This allowed excellent  compression at the fracture site and excellent alignment. Fl uroscopic  imaging was then used and the plate hardware all had good position. On  inspection of the distal tibial plafond, there was noted to be a sagittal  fracture line exiting at the superomedial margin of the tibial articular  surface. There was slight displacement and I was concerned about potential  further displacement. As such using percutaneous technique and fluoroscopic  guidance, a 4.5 cannulated screw was placed across this obtaining excellent  compression and closure of this fracture line and nicely stabilizing the  intraarticular segment of the pilon fracture. The more proximal metaphyseal  fracture lines were in overall excellent alignment. Fluoroscopic images were  then used to confirm proper positioning of all hardware and that the ankle  mortis was properly and congruently aligned and no evidence for  intraarticular  displaced fracture lines. The wounds were then copiously  irrigated. The lateral incision was closed with 2-0 Vicryl for the deep  fascia and subcu and intracuticular 3-0 Monocryl for the skin followed by  Steri-Strips. The medial stab wound was closed with a Monocryl and Steri-  Strips. Marcaine 0.5% with epinephrine was instilled about the skin  incisions. Topical antibiotic salve was used to coat the skin of the foot  and ankle but not over the incisions. A well padded stirrup splint with  ankle in neutral position was then applied. The tourniquet was let down. The  patient was then extubated and taken to the recovery room in stable  condition.       KMS/MEDQ  D:  07/03/2004  T:  07/03/2004  Job:  [id number]

## 2010-06-05 NOTE — Telephone Encounter (Signed)
Error

## 2010-10-13 LAB — CBC
HCT: 38
Hemoglobin: 12.6
MCV: 82.6
Platelets: 423 — ABNORMAL HIGH
WBC: 11.5 — ABNORMAL HIGH

## 2010-10-13 LAB — BASIC METABOLIC PANEL
BUN: 8
Chloride: 101
GFR calc non Af Amer: >60
Potassium: 4.2
Sodium: 138

## 2010-11-17 ENCOUNTER — Encounter: Payer: Self-pay | Admitting: Gastroenterology

## 2010-11-17 ENCOUNTER — Ambulatory Visit (INDEPENDENT_AMBULATORY_CARE_PROVIDER_SITE_OTHER): Payer: Federal, State, Local not specified - PPO | Admitting: Gastroenterology

## 2010-11-17 VITALS — BP 128/74 | HR 92 | Ht 63.0 in | Wt 188.0 lb

## 2010-11-17 DIAGNOSIS — K6289 Other specified diseases of anus and rectum: Secondary | ICD-10-CM

## 2010-11-17 MED ORDER — HYDROCORTISONE ACE-PRAMOXINE 2.5-1 % RE CREA: TOPICAL_CREAM | Freq: Two times a day (BID) | RECTAL | Status: AC

## 2010-11-17 MED ORDER — HYDROCORTISONE ACETATE 25 MG RE SUPP: 25.0000 mg | Freq: Every day | RECTAL | Status: DC

## 2010-11-17 NOTE — Progress Notes (Signed)
History of Present Illness: This is a 50 year old female with intermittent rectal pain for 6 months. She states with most bowel movements she will have rectal discomfort for 5-10 minutes following a bowel movement. She occasionally has straining but generally has no difficulties with bowel movements. She underwent colonoscopy in May 2012 with findings of small external and small internal hemorrhoids as well as adenomatous colon polyps. She has not taken any treatment for her hemorrhoids. Denies weight loss, abdominal pain, constipation, diarrhea, change in stool caliber, melena, hematochezia, nausea, vomiting, dysphagia, reflux symptoms, chest pain.  Current Medications, Allergies, Past Medical History, Past Surgical History, Family History and Social History were reviewed in [employer] record.  Physical Exam: General: Well developed , well nourished, no acute distress Head: Normocephalic and atraumatic Eyes:  sclerae anicteric, EOMI Ears: Normal auditory acuity Mouth: No deformity or lesions Lungs: Clear throughout to auscultation Heart: Regular rate and rhythm; no murmurs, rubs or bruits Abdomen: Soft, non tender and non distended. No masses, hepatosplenomegaly or hernias noted. Normal Bowel sounds Rectal: Small external hemorrhoidal tags, no internal lesions, no fissures, no tenderness, Hemoccult negative brown stool in the vault Musculoskeletal: Symmetrical with no gross deformities  Pulses:  Normal pulses noted Extremities: No clubbing, cyanosis, edema or deformities noted Neurological: Alert oriented x 4, grossly nonfocal Psychological:  Alert and cooperative. Normal mood and affect  Assessment and Recommendations:  1. Anal and rectal pain following defecation. Colonoscopy performed May 2012 showed small internal hemorrhoids. Exam today is only remarkable for small external hemorrhoidal tags. I do not see evidence of anal fissure and there is no tenderness. Plan to  treat hemorrhoids with a stool softener, Analpram cream topcially, Anusol-HC suppositories and standard rectal care measures. If symptoms persist consider empiric treatment for possible anal fissure.  2. Personal history of adenomatous colon polyps. Surveillance colonoscopy recommended May 2017.

## 2010-11-17 NOTE — Patient Instructions (Signed)
Patient given rectal care instructions. Use Analpram cream rectally twice daily. Samples provided for patient. Use Anusol HC suppositories one rectally at bedtime. A prescription has been sent to your pharmacy.  cc: Rodrigo Ran, MD

## 2010-12-30 ENCOUNTER — Encounter (HOSPITAL_BASED_OUTPATIENT_CLINIC_OR_DEPARTMENT_OTHER): Payer: Federal, State, Local not specified - PPO

## 2011-03-24 ENCOUNTER — Other Ambulatory Visit (HOSPITAL_COMMUNITY): Payer: Self-pay | Admitting: Obstetrics and Gynecology

## 2011-03-24 DIAGNOSIS — Z1231 Encounter for screening mammogram for malignant neoplasm of breast: Secondary | ICD-10-CM

## 2011-03-27 ENCOUNTER — Telehealth: Payer: Self-pay | Admitting: Gastroenterology

## 2011-03-27 NOTE — Telephone Encounter (Signed)
Patient having abdominal cramping and pain.  She reports she feels bloated and having pain like gas. I have given her some Align and Restora samples and asked her to try theses.  She is also asked to try gas x or phazyme and an anti-gas diet.  She will come in and see Dr Russella Dar on 04/01/11

## 2011-04-01 ENCOUNTER — Ambulatory Visit (INDEPENDENT_AMBULATORY_CARE_PROVIDER_SITE_OTHER): Payer: Federal, State, Local not specified - PPO | Admitting: Gastroenterology

## 2011-04-01 ENCOUNTER — Encounter: Payer: Self-pay | Admitting: Gastroenterology

## 2011-04-01 ENCOUNTER — Other Ambulatory Visit (INDEPENDENT_AMBULATORY_CARE_PROVIDER_SITE_OTHER): Payer: Federal, State, Local not specified - PPO

## 2011-04-01 VITALS — BP 118/90 | HR 84 | Ht 63.0 in | Wt 190.4 lb

## 2011-04-01 DIAGNOSIS — R1011 Right upper quadrant pain: Secondary | ICD-10-CM

## 2011-04-01 DIAGNOSIS — R198 Other specified symptoms and signs involving the digestive system and abdomen: Secondary | ICD-10-CM

## 2011-04-01 LAB — CBC WITH DIFFERENTIAL/PLATELET
Basophils Relative: 0.5 % (ref 0.0–3.0)
Hemoglobin: 13.3 g/dL (ref 12.0–15.0)
Lymphocytes Relative: 29.5 % (ref 12.0–46.0)
MCHC: 33.1 g/dL (ref 30.0–36.0)
Monocytes Relative: 5.8 % (ref 3.0–12.0)
Neutro Abs: 4.8 10*3/uL (ref 1.4–7.7)
RBC: 4.82 Mil/uL (ref 3.87–5.11)

## 2011-04-01 LAB — COMPREHENSIVE METABOLIC PANEL
AST: 22 U/L (ref 0–37)
BUN: 14 mg/dL (ref 6–23)
Calcium: 9.3 mg/dL (ref 8.4–10.5)
Chloride: 98 mEq/L (ref 96–112)
Creatinine, Ser: 0.7 mg/dL (ref 0.4–1.2)
GFR: 95.87 mL/min (ref >60.00)

## 2011-04-01 MED ORDER — GLYCOPYRROLATE 1 MG PO TABS: 1.0000 mg | ORAL_TABLET | Freq: Two times a day (BID) | ORAL | Status: DC

## 2011-04-01 MED ORDER — PANTOPRAZOLE SODIUM 40 MG PO TBEC: 40.0000 mg | DELAYED_RELEASE_TABLET | Freq: Every day | ORAL | Status: DC

## 2011-04-01 NOTE — Patient Instructions (Addendum)
Your physician has requested that you go to the basement for the following lab work before leaving today: Celiac panel, CMET, CBC, Lipase. We have sent the following medications to your pharmacy for you to pick up at your convenience:Pantoprazole, Robinul. Please schedule a f/u appt to see Dr. Russella Dar in 2-3 weeks. cc: Rodrigo Ran, MD

## 2011-04-01 NOTE — Progress Notes (Signed)
History of Present Illness: This is a 51 year old female who relates several weeks of burning, uncomfortable right upper quadrant abdominal pain associated with bloating. She does have a slight variation in her bowel habits with occasional mild constipation. She is concerned about celiac disease. Colonoscopy in May 2012 showed a 4 mm adenomatous polyp but was otherwise negative. Upper endoscopy in 2007 showed a small hiatal hernia and was otherwise negative. She had a tissue transglutaminase that was negative in 2009. She states she had an abdominal ultrasound  performed last summer that was negative. Her mother passed away in 2022/03/11 and she states she's been under significant stress since that time. She relates a 14 pound weight gain over the past few months. Denies weight loss, change in stool caliber, melena, hematochezia, nausea, vomiting, dysphagia, reflux symptoms, chest pain.  Current Medications, Allergies, Past Medical History, Past Surgical History, Family History and Social History were reviewed in [employer] record.  Physical Exam: General: Well developed , well nourished, no acute distress Head: Normocephalic and atraumatic Eyes:  sclerae anicteric, EOMI Ears: Normal auditory acuity Mouth: No deformity or lesions Lungs: Clear throughout to auscultation Heart: Regular rate and rhythm; no murmurs, rubs or bruits Abdomen: Soft, Mild right upper quadrant tenderness to deep palpation without rebound or guarding and non distended. No masses, hepatosplenomegaly or hernias noted. Normal Bowel sounds Musculoskeletal: Symmetrical with no gross deformities  Pulses:  Normal pulses noted Extremities: No clubbing, cyanosis, edema or deformities noted Neurological: Alert oriented x 4, grossly nonfocal Psychological:  Alert and cooperative. Normal mood and affect  Assessment and Recommendations:  1. Right upper quadrant pain with slight variation in bowel habits. Rule out  gastritis, duodenitis ulcer disease and irritable bowel syndrome. Obtain blood work including a full celiac panel. If her symptoms do not respond to  acid suppression and anti-spasmodics plan to proceed with upper endoscopy for further evaluation.   2. Personal history of adenomatous colon polyps. Surveillance colonoscopy recommended May 2017.

## 2011-04-02 ENCOUNTER — Telehealth: Payer: Self-pay | Admitting: Gastroenterology

## 2011-04-02 LAB — CELIAC PANEL 10
Endomysial Screen: NEGATIVE
Gliadin IgA: 1.3 U/mL (ref <20)
IgA: 279 mg/dL (ref 69–380)
Tissue Transglutaminase Ab, IgA: 3.5 U/mL (ref <20)

## 2011-04-02 NOTE — Telephone Encounter (Signed)
I reviewed the results with the patient.

## 2011-04-20 ENCOUNTER — Ambulatory Visit (HOSPITAL_COMMUNITY)
Admission: RE | Admit: 2011-04-20 | Discharge: 2011-04-20 | Disposition: A | Discharge status: Home / Self Care | Payer: Federal, State, Local not specified - PPO | Source: Ambulatory Visit | Attending: Obstetrics and Gynecology | Admitting: Obstetrics and Gynecology

## 2011-04-20 DIAGNOSIS — Z1231 Encounter for screening mammogram for malignant neoplasm of breast: Secondary | ICD-10-CM

## 2011-04-27 ENCOUNTER — Encounter: Payer: Self-pay | Admitting: Gastroenterology

## 2011-04-27 ENCOUNTER — Ambulatory Visit (INDEPENDENT_AMBULATORY_CARE_PROVIDER_SITE_OTHER): Payer: Federal, State, Local not specified - PPO | Admitting: Gastroenterology

## 2011-04-27 VITALS — BP 100/76 | HR 80 | Ht 63.0 in | Wt 191.4 lb

## 2011-04-27 DIAGNOSIS — K219 Gastro-esophageal reflux disease without esophagitis: Secondary | ICD-10-CM

## 2011-04-27 DIAGNOSIS — K589 Irritable bowel syndrome without diarrhea: Secondary | ICD-10-CM

## 2011-04-27 DIAGNOSIS — R1011 Right upper quadrant pain: Secondary | ICD-10-CM

## 2011-04-27 NOTE — Progress Notes (Signed)
History of Present Illness: This is a 51 year old female who returns for followup of right upper quadrant pain and GERD. Her reflux symptoms have come under very good control and pantoprazole. She has not tried Robinul as recommended. She notes a "hot pain" in her right upper quadrant that occurs about every other day. She notes urgent bowel movements with mucus in her stool. She notes increased abdominal complaints when she had substantial gluten in her diet so she has been minimizing gluten.  Current Medications, Allergies, Past Medical History, Past Surgical History, Family History and Social History were reviewed in [employer] record.  Physical Exam: General: Well developed , well nourished, no acute distress Head: Normocephalic and atraumatic Eyes:  sclerae anicteric, EOMI Ears: Normal auditory acuity Mouth: No deformity or lesions Lungs: Clear throughout to auscultation Heart: Regular rate and rhythm; no murmurs, rubs or bruits Abdomen: Soft, non tender and non distended. No masses, hepatosplenomegaly or hernias noted. Normal Bowel sounds Musculoskeletal: Symmetrical with no gross deformities  Extremities: No clubbing, cyanosis, edema or deformities noted Neurological: Alert oriented x 4, grossly nonfocal Psychological:  Alert and cooperative. Anxious.  Assessment and Recommendations:  1. GERD. Continue standard antireflux measures and pantoprazole 40 mg daily  2. Right upper quadrant pain. I suspect this is irritable bowel syndrome. Rule out acalculus cholecystitis. Schedule CCK HIDA. I encouraged her to try Robinul as recommended. If she decides not to stay on this medication at least we will able to determine if it helps alleviate her symptoms.  3. Irritable bowel syndrome

## 2011-04-27 NOTE — Patient Instructions (Signed)
You have been scheduled for an Hida Scan at [facility] (1st floor of hospital) on 05/06/11 at 10:00am. Please arrive 15 minutes prior to your appointment for registration. Make certain not to have anything to eat or drink 6 hours prior to your appointment. Should you need to reschedule your appointment, please contact radiology at [phone]. cc: Rodrigo Ran, MD

## 2011-05-06 ENCOUNTER — Other Ambulatory Visit (HOSPITAL_COMMUNITY): Payer: Federal, State, Local not specified - PPO

## 2011-05-14 ENCOUNTER — Encounter (HOSPITAL_BASED_OUTPATIENT_CLINIC_OR_DEPARTMENT_OTHER): Payer: Federal, State, Local not specified - PPO

## 2011-06-10 ENCOUNTER — Ambulatory Visit: Payer: Self-pay | Admitting: Cardiovascular Disease

## 2011-06-10 ENCOUNTER — Encounter: Payer: Self-pay | Admitting: Cardiovascular Disease

## 2011-06-13 DIAGNOSIS — I493 Ventricular premature depolarization: Secondary | ICD-10-CM

## 2011-06-13 HISTORY — DX: Ventricular premature depolarization: I49.3

## 2011-08-27 ENCOUNTER — Encounter (HOSPITAL_BASED_OUTPATIENT_CLINIC_OR_DEPARTMENT_OTHER): Payer: Federal, State, Local not specified - PPO

## 2012-02-01 ENCOUNTER — Encounter: Payer: Self-pay | Admitting: *Deleted

## 2012-02-01 ENCOUNTER — Emergency Department (INDEPENDENT_AMBULATORY_CARE_PROVIDER_SITE_OTHER): Payer: Federal, State, Local not specified - PPO

## 2012-02-01 ENCOUNTER — Emergency Department (INDEPENDENT_AMBULATORY_CARE_PROVIDER_SITE_OTHER)
Admission: EM | Admit: 2012-02-01 | Discharge: 2012-02-01 | Disposition: A | Discharge status: Home / Self Care | Payer: Federal, State, Local not specified - PPO | Source: Home / Self Care | Attending: Family Medicine | Admitting: Family Medicine

## 2012-02-01 DIAGNOSIS — J45901 Unspecified asthma with (acute) exacerbation: Secondary | ICD-10-CM

## 2012-02-01 DIAGNOSIS — R0989 Other specified symptoms and signs involving the circulatory and respiratory systems: Secondary | ICD-10-CM

## 2012-02-01 DIAGNOSIS — R059 Cough, unspecified: Secondary | ICD-10-CM

## 2012-02-01 DIAGNOSIS — R05 Cough: Secondary | ICD-10-CM

## 2012-02-01 DIAGNOSIS — J069 Acute upper respiratory infection, unspecified: Secondary | ICD-10-CM

## 2012-02-01 DIAGNOSIS — R062 Wheezing: Secondary | ICD-10-CM

## 2012-02-01 DIAGNOSIS — R509 Fever, unspecified: Secondary | ICD-10-CM

## 2012-02-01 LAB — POCT MONO SCREEN (KUC): Mono, POC: NEGATIVE

## 2012-02-01 LAB — POCT INFLUENZA A/B
Influenza A, POC: NEGATIVE
Influenza B, POC: NEGATIVE

## 2012-02-01 LAB — POCT RAPID STREP A (OFFICE): Rapid Strep A Screen: NEGATIVE

## 2012-02-01 MED ORDER — AZITHROMYCIN 250 MG PO TABS: ORAL_TABLET | ORAL | Status: DC

## 2012-02-01 NOTE — ED Provider Notes (Signed)
History     CSN: [id number]  Arrival date & time 02/01/12  1331   None     Chief Complaint  Patient presents with  . Cough  . Fever  . Generalized Body Aches   HPI  URI Symptoms Onset: 3 days  Description: rhinorrhea, nasal congestion, cough, generalized malaise, fever, chills  Modifying factors:  Had strep, mono, and MRSA exposure from daughter who came back from college. Has not had flu shot.  Baseline hx/o asthma.   Symptoms Nasal discharge: yes Fever: yes Sore throat: no Cough: yes Wheezing: yes Ear pain: no GI symptoms: no Sick contacts: yes  Red Flags  Stiff neck: no Dyspnea: yes Rash: no Swallowing difficulty: no  Sinusitis Risk Factors Headache/face pain: no Double sickening: no tooth pain: no  Allergy Risk Factors Sneezing: no Itchy scratchy throat: no Seasonal symptoms: no  Flu Risk Factors Headache: yes muscle aches: yes severe fatigue: yes   Past Medical History  Diagnosis Date  . Asthma   . Iron deficiency anemia   . IBS (irritable bowel syndrome)   . Allergic rhinitis   . Obstructive sleep apnea   . GERD (gastroesophageal reflux disease)   . Hemorrhoids   . Diverticulosis   . Tubular adenoma of colon 05/2010    Past Surgical History  Procedure Date  . Cesarean section     x 2  . Ankle surgery     right- placement of screws and pins with subsequent removal  . Nasal septum surgery   . Tonsillectomy     Family History  Problem Relation Age of Onset  . Colon polyps Mother     adenomatous polyps in her 43's  . Colon cancer Maternal Grandmother     in her 48's  . Colitis Brother   . Diverticulitis Brother   . Alzheimer's disease Mother   . Stomach cancer Brother     half brother  . Diabetes Mother   . Heart disease Mother     History  Substance Use Topics  . Smoking status: Never Smoker   . Smokeless tobacco: Never Used  . Alcohol Use: Yes     Comment: socially, 1 beer once a week.    OB History    Grav Para  Term Preterm Abortions TAB SAB Ect Mult Living                  Review of Systems  All other systems reviewed and are negative.    Allergies  Aspirin and Shellfish-derived products  Home Medications   Current Outpatient Rx  Name  Route  Sig  Dispense  Refill  . ALPRAZOLAM 0.5 MG PO TABS      0.25 mg as needed.          Marland Kitchen VITAMIN C 500 MG PO TABS   Oral   Take 500 mg by mouth daily.           Marland Kitchen EPIPEN 2-PAK 0.3 MG/0.3ML IJ DEVI      as needed.          Marland Kitchen FLAXSEED OIL 1000 MG PO CAPS   Oral   Take 1 capsule by mouth daily.         . IBUPROFEN 200 MG PO TABS   Oral   Take 200 mg by mouth every 6 (six) hours as needed.           Marland Kitchen LEVOCETIRIZINE DIHYDROCHLORIDE 5 MG PO TABS   Oral   Take 2.5 mg  by mouth every evening.           . MULTIVITAMIN PO   Oral   Take 1 tablet by mouth daily.           Marland Kitchen PANTOPRAZOLE SODIUM 40 MG PO TBEC   Oral   Take 40 mg by mouth daily as needed.           BP 150/95  Pulse 90  Temp 99 F (37.2 C) (Oral)  Resp 16  Ht 6' (1.829 m)  Wt 294 lb (133.358 kg)  BMI 39.87 kg/m2  SpO2 94%  LMP 02/07/2011  Physical Exam  Constitutional: She appears well-developed and well-nourished.  HENT:  Head: Normocephalic and atraumatic.  Right Ear: External ear normal.  Left Ear: External ear normal.       +nasal erythema, rhinorrhea bilaterally, + post oropharyngeal erythema    Eyes: Conjunctivae normal are normal. Pupils are equal, round, and reactive to light.  Neck: Normal range of motion. Neck supple.  Cardiovascular: Normal rate, regular rhythm and normal heart sounds.   Pulmonary/Chest: Effort normal. She has wheezes.       + wheezes and course breath sounds diffusely   Abdominal: Soft.  Musculoskeletal: Normal range of motion.  Neurological: She is alert.  Skin: Skin is warm.    ED Course  Procedures (including critical care time)   Labs Reviewed  POCT RAPID STREP A (OFFICE)  POCT MONO SCREEN ([facility])    POCT INFLUENZA A/B   Dg Chest 2 View  02/01/2012  *RADIOLOGY REPORT*  Clinical Data: Cough, wheezing, congestion  CHEST - 2 VIEW  Comparison: None.  Findings: Cardiomediastinal silhouette is unremarkable.  No acute infiltrate or pleural effusion.  No pulmonary edema.  Mild degenerative changes mid thoracic spine.  IMPRESSION: No active disease.  Mild degenerative changes mid thoracic spine.   Original Report Authenticated By: Natasha Mead, M.D.      1. URI (upper respiratory infection)   2. Asthma exacerbation       MDM  URI with secondary asthma exacerbation.  Pt does not desire oral or IM steroids. She feels that this will cause anxiety flares. Pt states that she has an inhaled steroid at home (though pt and husband admit that pt has never used medication)  >25mins was spent with pt discussing treatment plan and medications.  Discussed risks of not using medications from a resp standpoint extensively. Pt expressed understanding.  zpak for atypical coverage.  Follow up with PCP in 5-7 days.  Go to ER if resp sxs worsen.    The patient and/or caregiver has been counseled thoroughly with regard to treatment plan and/or medications prescribed including dosage, schedule, interactions, rationale for use, and possible side effects and they verbalize understanding. Diagnoses and expected course of recovery discussed and will return if not improved as expected or if the condition worsens. Patient and/or caregiver verbalized understanding.               Doree Albee, MD 02/01/12 [phone]

## 2012-02-01 NOTE — ED Notes (Signed)
Pt c/o fever, cough, and body aches x 4 days.

## 2012-02-15 ENCOUNTER — Encounter: Payer: Self-pay | Admitting: *Deleted

## 2012-02-15 ENCOUNTER — Emergency Department
Admission: EM | Admit: 2012-02-15 | Discharge: 2012-02-15 | Disposition: A | Discharge status: Home / Self Care | Payer: Federal, State, Local not specified - PPO | Source: Home / Self Care | Attending: Family Medicine | Admitting: Family Medicine

## 2012-02-15 ENCOUNTER — Emergency Department
Admission: EM | Admit: 2012-02-15 | Discharge: 2012-02-15 | Discharge status: Absent without leave | Payer: Self-pay | Source: Home / Self Care

## 2012-02-15 DIAGNOSIS — S29011A Strain of muscle and tendon of front wall of thorax, initial encounter: Secondary | ICD-10-CM

## 2012-02-15 DIAGNOSIS — S2341XA Sprain of ribs, initial encounter: Secondary | ICD-10-CM

## 2012-02-15 DIAGNOSIS — R05 Cough: Secondary | ICD-10-CM

## 2012-02-15 DIAGNOSIS — R053 Chronic cough: Secondary | ICD-10-CM

## 2012-02-15 DIAGNOSIS — R059 Cough, unspecified: Secondary | ICD-10-CM

## 2012-02-15 MED ORDER — CLARITHROMYCIN 500 MG PO TABS: 500.0000 mg | ORAL_TABLET | Freq: Two times a day (BID) | ORAL | Status: DC

## 2012-02-15 MED ORDER — BENZONATATE 200 MG PO CAPS: 200.0000 mg | ORAL_CAPSULE | Freq: Every day | ORAL | Status: DC

## 2012-02-15 NOTE — ED Provider Notes (Signed)
History     CSN: [id number]  Arrival date & time 02/15/12  5621   None     Chief Complaint  Patient presents with  . Cough  . Chest Pain    LT rib pain     HPI Comments: Patient complains of onset of URI symptoms about two weeks ago and was seen here 02/01/12 with secondary asthma exacerbation.  She was started on a Z-pack but refused anti-inflammatory medication.  She states that she has a QVAR inhaler at home but has not used it. Since then her sinus congestion has resolved, but she continues to have a non-productive cough worse at night.  She does not wheeze but occasionally feels shortness of breath.  About a week ago she developed mild pleuritic pain in her left anterior/lateral chest.  No recent fevers, chills, and sweats.  She sometimes coughs until she gags.  She believes that she had a Tdap two years ago.  The history is provided by the patient.    Past Medical History  Diagnosis Date  . Asthma   . Iron deficiency anemia   . IBS (irritable bowel syndrome)   . Allergic rhinitis   . Obstructive sleep apnea   . GERD (gastroesophageal reflux disease)   . Hemorrhoids   . Diverticulosis   . Tubular adenoma of colon 05/2010    Past Surgical History  Procedure Date  . Cesarean section     x 2  . Ankle surgery     right- placement of screws and pins with subsequent removal  . Nasal septum surgery   . Tonsillectomy     Family History  Problem Relation Age of Onset  . Colon polyps Mother     adenomatous polyps in her 47's  . Colon cancer Maternal Grandmother     in her 69's  . Colitis Brother   . Diverticulitis Brother   . Alzheimer's disease Mother   . Stomach cancer Brother     half brother  . Diabetes Mother   . Heart disease Mother     History  Substance Use Topics  . Smoking status: Never Smoker   . Smokeless tobacco: Never Used  . Alcohol Use: Yes     Comment: socially, 1 beer once a week.    OB History    Grav Para Term Preterm Abortions TAB SAB  Ect Mult Living                  Review of Systems No sore throat + cough ? pleuritic pain left lateral chest No wheezing Minimal nasal congestion No post-nasal drainage No sinus pain/pressure No itchy/red eyes No earache No hemoptysis No SOB No fever/chills No nausea No vomiting No abdominal pain No diarrhea No urinary symptoms No skin rashes No fatigue No myalgias No headache   Allergies  Aspirin; Shellfish-derived products; and Sulfa antibiotics  Home Medications   Current Outpatient Rx  Name  Route  Sig  Dispense  Refill  . ALPRAZOLAM 0.5 MG PO TABS      0.25 mg as needed.          Marland Kitchen VITAMIN C 500 MG PO TABS   Oral   Take 500 mg by mouth daily.           . AZITHROMYCIN 250 MG PO TABS      Take 2 tabs PO x 1 dose, then 1 tab PO QD x 4 days   6 tablet   0   . BENZONATATE  200 MG PO CAPS   Oral   Take 1 capsule (200 mg total) by mouth at bedtime. Take as needed for cough   12 capsule   0   . CLARITHROMYCIN 500 MG PO TABS   Oral   Take 1 tablet (500 mg total) by mouth 2 (two) times daily. (Rx void after 02/23/12)   14 tablet   0   . EPIPEN 2-PAK 0.3 MG/0.3ML IJ DEVI      as needed.          Marland Kitchen FLAXSEED OIL 1000 MG PO CAPS   Oral   Take 1 capsule by mouth daily.         . IBUPROFEN 200 MG PO TABS   Oral   Take 200 mg by mouth every 6 (six) hours as needed.           Marland Kitchen LEVOCETIRIZINE DIHYDROCHLORIDE 5 MG PO TABS   Oral   Take 2.5 mg by mouth every evening.           . MULTIVITAMIN PO   Oral   Take 1 tablet by mouth daily.           Marland Kitchen PANTOPRAZOLE SODIUM 40 MG PO TBEC   Oral   Take 40 mg by mouth daily as needed.           BP 126/82  Pulse 78  Temp 98.5 F (36.9 C) (Oral)  Resp 18  Ht 5\' 3"  (1.6 m)  Wt 194 lb (87.998 kg)  BMI 34.37 kg/m2  SpO2 97%  LMP 02/07/2011  Physical Exam  Pulmonary/Chest:         There is mild diffuse intercostal muscle tenderness left anterior/lateral chest as noted on diagram.    Nursing notes and Vital Signs reviewed. Appearance:  Patient appears stated age, and in no acute distress.  Patient is obese (BMI 34.4) Eyes:  Pupils are equal, round, and reactive to light and accomodation.  Extraocular movement is intact.  Conjunctivae are not inflamed  Ears:  Canals normal.  Tympanic membranes normal.  Nose:  Mildly congested turbinates.  No sinus tenderness.   Pharynx:  Normal Neck:  Supple.  Tender shotty posterior nodes are palpated bilaterally  Lungs:  Clear to auscultation.  Breath sounds are equal.  Chest:  There is mild tenderness to palpation over the mid-sternum.  Heart:  Regular rate and rhythm without murmurs, rubs, or gallops.  Abdomen:  Nontender without masses or hepatosplenomegaly.  Bowel sounds are present.  No CVA or flank tenderness.  Extremities:  No edema.  No calf tenderness Skin:  No rash present.   ED Course  Procedures none   Previous chart note and chest X-ray reviewed   1. Persistent cough; suspect resolving URI with post-infectious cough and bronchospasm.  Doubt pertussis because she believes she had a Tdap two years ago.  2. Intercostal muscle strain       MDM  There is no evidence of bacterial infection today.   Treat symptomatically for now: Prescription written for Benzonatate [facility]) to take at bedtime for night-time cough.  Take plain Mucinex (guaifenesin) twice daily for cough and congestion.  Increase fluid intake, rest. Begin QVAR and continue about 1 week after cough resolves Begin Biaxin if not improving about 5 days or if persistent fever develops. Follow-up with family doctor if not improving 7 to 10 days.         Lattie Haw, MD 02/15/12 [phone]

## 2012-02-15 NOTE — ED Notes (Signed)
Pt c/o productive cough, SOB, and LT rib pain x 01/06/12. Denies fever.

## 2012-02-17 ENCOUNTER — Encounter: Payer: Self-pay | Admitting: Internal Medicine

## 2012-02-17 ENCOUNTER — Ambulatory Visit (INDEPENDENT_AMBULATORY_CARE_PROVIDER_SITE_OTHER): Payer: Federal, State, Local not specified - PPO | Admitting: Internal Medicine

## 2012-02-17 VITALS — BP 140/90 | HR 90 | Ht 63.0 in | Wt 195.0 lb

## 2012-02-17 DIAGNOSIS — R05 Cough: Secondary | ICD-10-CM

## 2012-02-17 DIAGNOSIS — R059 Cough, unspecified: Secondary | ICD-10-CM

## 2012-02-17 DIAGNOSIS — R079 Chest pain, unspecified: Secondary | ICD-10-CM

## 2012-02-17 MED ORDER — ESOMEPRAZOLE MAGNESIUM 40 MG PO PACK: 40.0000 mg | PACK | Freq: Every day | ORAL | Status: DC

## 2012-02-17 MED ORDER — TRAMADOL HCL 50 MG PO TABS: ORAL_TABLET | ORAL | Status: DC

## 2012-02-17 MED ORDER — PREDNISONE (PAK) 10 MG PO TABS: ORAL_TABLET | ORAL | Status: DC

## 2012-02-17 NOTE — Progress Notes (Signed)
  Subjective:    Patient ID: [name] Horn, female    DOB: 12/22/1960  MRN: [id number]  HPIc  51 yowf never smoker eczyema as child and took shots until age 52 never asthma until cat exposure around 2004 resulting in er eval and allergy eval  By Barnetta Chapel found she was allergic to "everything" so started on shots and no other meds needed but stopped them after 2 years and did 100% fine until onset of cough in Dec 2013 self referred 02/17/2012 to pulmonary clinic.   02/17/2012 1st pulmonary eval cc new abrupt onset cough xmas 2013 cough with fever already treated with zpak fever resolved but coughing daily > nightly assoc with L post and lat cp. Min mucoid sputum and sense of chest tightness mostly with coughing- has some nasal congestion and watery nasal discharge but " this feels different from my asthma/allergies"  No better with inhalers  No obvious daytime variabilty or assoc  subjective wheeze overt sinus or hb symptoms. No unusual exp hx   Sleeping ok without nocturnal  or early am exacerbation  of respiratory  c/o's or need for noct saba. Also denies any obvious fluctuation of symptoms with weather or environmental changes or other aggravating or alleviating factors except as outlined above     Review of Systems  Constitutional: Negative for fever and unexpected weight change.  HENT: Positive for congestion and rhinorrhea. Negative for ear pain, nosebleeds, sore throat, sneezing, trouble swallowing, dental problem, postnasal drip and sinus pressure.   Eyes: Negative for redness and itching.  Respiratory: Positive for cough and chest tightness. Negative for shortness of breath and wheezing.   Cardiovascular: Negative for palpitations and leg swelling.  Gastrointestinal: Negative for nausea and vomiting.  Genitourinary: Negative for dysuria.  Musculoskeletal: Negative for joint swelling.  Skin: Negative for rash.  Neurological: Negative for headaches.  Hematological: Does not bruise/bleed  easily.  Psychiatric/Behavioral: Negative for dysphoric mood. The patient is not nervous/anxious.        Objective:   Physical Exam   Pleasant amb mildly obese wf nad Wt 195 02/17/12 HEENT: nl dentition, turbinates, and orophanx. Nl external ear canals without cough reflex   NECK :  without JVD/Nodes/TM/ nl carotid upstrokes bilaterally   LUNGS: no acc muscle use, clear to A and P bilaterally without cough on insp or exp maneuvers   CV:  RRR  no s3 or murmur or increase in P2, no edema   ABD:  soft and nontender with nl excursion in the supine position. No bruits or organomegaly, bowel sounds nl  MS:  warm without deformities, calf tenderness, cyanosis or clubbing  SKIN: warm and dry without lesions    NEURO:  alert, approp, no deficits    cxr 02/01/12 reviewed No active disease. Mild degenerative changes mid thoracic spine.       Assessment & Plan:

## 2012-02-17 NOTE — Patient Instructions (Addendum)
The key to effective treatment for your cough is eliminating the non-stop cycle of cough you're stuck in long enough to let your airway heal completely and then see if there is anything still making you cough once you stop the cough suppression, but this should take no more than 5 days to figure out  First take delsym two tsp every 12 hours and supplement if needed with  tramadol 50 mg up to 2 every 4 hours to suppress the urge to cough. Swallowing water or using ice chips/non mint and menthol containing candies (such as lifesavers or sugarless jolly ranchers) are also effective.  You should rest your voice and avoid activities that you know make you cough.  Once you have eliminated the cough for 3 straight days try reducing the tramadol first,  then the delsym as tolerated.    Try nexium 40 mg   Take 30-60 min before first meal of the day and Pepcid 20 mg one bedtime until cough is completely gone for at least a week without the need for cough suppression   GERD (REFLUX)  is an extremely common cause of respiratory symptoms, many times with no significant heartburn at all.    It can be treated with medication, but also with lifestyle changes including avoidance of late meals, excessive alcohol, smoking cessation, and avoid fatty foods, chocolate, peppermint, colas, red wine, and acidic juices such as orange juice.  NO MINT OR MENTHOL PRODUCTS SO NO COUGH DROPS  USE SUGARLESS CANDY INSTEAD (jolley ranchers or Stover's)  NO OIL BASED VITAMINS - use powdered substitutes.    Prednisone 10 mg take  4 each am x 2 days,   2 each am x 2 days,  1 each am x2days and stop

## 2012-02-18 DIAGNOSIS — R079 Chest pain, unspecified: Secondary | ICD-10-CM | POA: Insufficient documentation

## 2012-02-18 DIAGNOSIS — R059 Cough, unspecified: Secondary | ICD-10-CM | POA: Insufficient documentation

## 2012-02-18 DIAGNOSIS — R05 Cough: Secondary | ICD-10-CM | POA: Insufficient documentation

## 2012-02-18 NOTE — Assessment & Plan Note (Signed)
The most common causes of chronic cough in immunocompetent adults include the following: upper airway cough syndrome (UACS), previously referred to as postnasal drip syndrome (PNDS), which is caused by variety of rhinosinus conditions; (2) asthma; (3) GERD; (4) chronic bronchitis from cigarette smoking or other inhaled environmental irritants; (5) nonasthmatic eosinophilic bronchitis; and (6) bronchiectasis.   These conditions, singly or in combination, have accounted for up to 94% of the causes of chronic cough in prospective studies.   Other conditions have constituted no >6% of the causes in prospective studies These have included bronchogenic carcinoma, chronic interstitial pneumonia, sarcoidosis, left ventricular failure, ACEI-induced cough, and aspiration from a condition associated with pharyngeal dysfunction.   .Chronic cough is often simultaneously caused by more than one condition. A single cause has been found from 38 to 82% of the time, multiple causes from 18 to 62%. Multiply caused cough has been the result of three diseases up to 42% of the time.    Of the three most common causes of chronic cough, only one (GERD)  can actually cause the other two (asthma and post nasal drip syndrome)  and perpetuate the cylce of cough inducing airway trauma, inflammation, heightened sensitivity to reflux which is prompted by the cough itself via a cyclical mechanism.    This may partially respond to steroids and look like asthma and post nasal drainage but never erradicated completely unless the cough and the secondary reflux are eliminated, preferably both at the same time.  While not intuitively obvious, many patients with chronic low grade reflux do not cough until there is a secondary insult that disturbs the protective epithelial barrier and exposes sensitive nerve endings.  This can be viral or direct physical injury such as with an endotracheal tube.   The point is that once this occurs, it is  difficult to eliminate using anything but a maximally effective acid suppression regimen at least in the short run, accompanied by an appropriate diet to address non acid GERD.    See instructions for specific recommendations which were reviewed directly with the patient who was given a copy with highlighter outlining the key components.  

## 2012-02-18 NOTE — Assessment & Plan Note (Signed)
Classic mscp from cough so rec address the cough first then regroup if not better.

## 2012-02-26 ENCOUNTER — Institutional Professional Consult (permissible substitution): Payer: Federal, State, Local not specified - PPO | Admitting: Internal Medicine

## 2012-05-03 ENCOUNTER — Other Ambulatory Visit (HOSPITAL_COMMUNITY): Payer: Self-pay | Admitting: Obstetrics and Gynecology

## 2012-05-03 DIAGNOSIS — Z1231 Encounter for screening mammogram for malignant neoplasm of breast: Secondary | ICD-10-CM

## 2012-05-10 ENCOUNTER — Ambulatory Visit (HOSPITAL_COMMUNITY): Payer: Federal, State, Local not specified - PPO

## 2012-05-17 ENCOUNTER — Ambulatory Visit (HOSPITAL_COMMUNITY): Payer: Federal, State, Local not specified - PPO

## 2012-05-26 ENCOUNTER — Ambulatory Visit (HOSPITAL_COMMUNITY)
Admission: RE | Admit: 2012-05-26 | Discharge: 2012-05-26 | Disposition: A | Discharge status: Home / Self Care | Payer: Federal, State, Local not specified - PPO | Source: Ambulatory Visit | Attending: Obstetrics and Gynecology | Admitting: Obstetrics and Gynecology

## 2012-05-26 DIAGNOSIS — Z1231 Encounter for screening mammogram for malignant neoplasm of breast: Secondary | ICD-10-CM

## 2012-06-28 ENCOUNTER — Other Ambulatory Visit: Payer: Self-pay | Admitting: Obstetrics and Gynecology

## 2012-06-28 DIAGNOSIS — R922 Inconclusive mammogram: Secondary | ICD-10-CM

## 2012-07-07 ENCOUNTER — Other Ambulatory Visit: Payer: Federal, State, Local not specified - PPO

## 2012-07-11 ENCOUNTER — Ambulatory Visit
Admission: RE | Admit: 2012-07-11 | Discharge: 2012-07-11 | Disposition: A | Discharge status: Home / Self Care | Payer: Federal, State, Local not specified - PPO | Source: Ambulatory Visit | Attending: Obstetrics and Gynecology | Admitting: Obstetrics and Gynecology

## 2012-07-11 DIAGNOSIS — R922 Inconclusive mammogram: Secondary | ICD-10-CM

## 2012-07-11 MED ORDER — GADOBENATE DIMEGLUMINE 529 MG/ML IV SOLN
18.0000 mL | Freq: Once | INTRAVENOUS | Status: AC | PRN
  Administered 2012-07-11: 18 mL via INTRAVENOUS

## 2012-08-04 ENCOUNTER — Encounter: Payer: Self-pay | Admitting: Cardiovascular Disease

## 2012-08-04 ENCOUNTER — Encounter: Payer: Self-pay | Admitting: Cardiology

## 2012-08-04 ENCOUNTER — Ambulatory Visit: Payer: Federal, State, Local not specified - PPO | Admitting: Cardiovascular Disease

## 2013-01-18 ENCOUNTER — Encounter: Payer: Self-pay | Admitting: Internal Medicine

## 2013-01-18 ENCOUNTER — Encounter (INDEPENDENT_AMBULATORY_CARE_PROVIDER_SITE_OTHER): Payer: Self-pay

## 2013-01-18 ENCOUNTER — Ambulatory Visit (INDEPENDENT_AMBULATORY_CARE_PROVIDER_SITE_OTHER): Payer: Federal, State, Local not specified - PPO | Admitting: Internal Medicine

## 2013-01-18 VITALS — BP 120/86 | HR 80 | Temp 98.0°F | Ht 63.0 in | Wt 192.8 lb

## 2013-01-18 DIAGNOSIS — K219 Gastro-esophageal reflux disease without esophagitis: Secondary | ICD-10-CM

## 2013-01-18 NOTE — Patient Instructions (Addendum)
In the event of cough or respiratory flare:   Nexium  40 mg   Take 30-60 min before first meal of the day and Pepcid 20 mg one bedtime until  No need for cough medication for a whole week  Best cough medication is delsym  GERD (REFLUX)  is an extremely common cause of respiratory symptoms, many times with no significant heartburn at all.    It can be treated with medication, but also with lifestyle changes including avoidance of late meals, excessive alcohol, smoking cessation, and avoid fatty foods, chocolate, peppermint, colas, red wine, and acidic juices such as orange juice.  NO MINT OR MENTHOL PRODUCTS SO NO COUGH DROPS  USE SUGARLESS CANDY INSTEAD (jolley ranchers or Stover's)  NO OIL BASED VITAMINS - use powdered substitutes.

## 2013-01-18 NOTE — Progress Notes (Signed)
Subjective:    Patient ID: [name] Horn, female    DOB: 05/18/1960  MRN: [id number]    Brief patient profile:  53 yowf never smoker eczyema as child and took shots until age 53 never asthma until cat exposure around 2004 resulting in er eval and allergy eval  By Orvil Feil found she was allergic to "everything" so started on shots and no other meds needed but stopped them after 2 years and did 100% fine until onset of cough in Dec 2013 self referred 02/17/2012 to pulmonary clinic.  History of Present Illness  02/17/2012 1st pulmonary eval cc new abrupt onset cough xmas 2013 cough with fever already treated with zpak fever resolved but coughing daily > nightly assoc with L post and lat cp. Min mucoid sputum and sense of chest tightness mostly with coughing- has some nasal congestion and watery nasal discharge but " this feels different from my asthma/allergies"  No better with inhalers rec The key to effective treatment for your cough is eliminating the non-stop cycle of cough you're stuck in long enough to let your airway heal completely and then see if there is anything still making you cough once you stop the cough suppression, but this should take no more than 5 days to figure out  First take delsym two tsp every 12 hours and supplement if needed with  tramadol 50 mg up to 2 every 4 hours to suppress the urge to cough  Once you have eliminated the cough for 3 straight days try reducing the tramadol first,  then the delsym as tolerated.   Try nexium 40 mg   Take 30-60 min before first meal of the day and Pepcid 20 mg one bedtime until cough is completely gone for at least a week without the need for cough suppression GERD (REFLUX)  Prednisone 10 mg take  4 each am x 2 days,   2 each am x 2 days,  1 each am x2days and stop    01/18/2013 f/u ov/[name] Horn re: recurrent cough Chief Complaint  Patient presents with  . Follow-up    Pt states that she developed respiratory infection in late Nov with fever  and cough. She states that her symptoms have resolved and she is feeling well now.   around Dec 1st had a flare on no maint medication rx zpak by her primary  And qvar x one week then got sick with cough  around xmad p exposure took another zpak> 100%  - concerned she keeps getting uri's -did not implement gerd rx with either illness "no heartburn" - did not get sob or need saba.   No obvious day to day or daytime variabilty or assoc chronic cough or cp or chest tightness, subjective wheeze overt sinus or hb symptoms. No unusual exp hx or h/o childhood pna/ asthma or knowledge of premature birth.  Sleeping ok without nocturnal  or early am exacerbation  of respiratory  c/o's or need for noct saba. Also denies any obvious fluctuation of symptoms with weather or environmental changes or other aggravating or alleviating factors except as outlined above   Current Medications, Allergies, Complete Past Medical History, Past Surgical History, Family History, and Social History were reviewed in [employer] record.  ROS  The following are not active complaints unless bolded sore throat, dysphagia, dental problems, itching, sneezing,  nasal congestion or excess/ purulent secretions, ear ache,   fever, chills, sweats, unintended wt loss, pleuritic or exertional cp, hemoptysis,  orthopnea pnd  or leg swelling, presyncope, palpitations, heartburn, abdominal pain, anorexia, nausea, vomiting, diarrhea  or change in bowel or urinary habits, change in stools or urine, dysuria,hematuria,  rash, arthralgias, visual complaints, headache, numbness weakness or ataxia or problems with walking or coordination,  change in mood/affect or memory.                    Objective:   Physical Exam   Pleasant amb mildly obese wf nad  Wt 195 02/17/12 > 01/18/2013  192  HEENT: nl dentition, turbinates, and orophanx. Nl external ear canals without cough reflex   NECK :  without JVD/Nodes/TM/ nl carotid  upstrokes bilaterally   LUNGS: no acc muscle use, clear to A and P bilaterally without cough on insp or exp maneuvers   CV:  RRR  no s3 or murmur or increase in P2, no edema   ABD:  soft and nontender with nl excursion in the supine position. No bruits or organomegaly, bowel sounds nl  MS:  warm without deformities, calf tenderness, cyanosis or clubbing  SKIN: warm and dry without lesions    NEURO:  alert, approp, no deficits    cxr 02/01/12 reviewed No active disease. Mild degenerative changes mid thoracic spine.       Assessment & Plan:

## 2013-01-20 NOTE — Assessment & Plan Note (Signed)
Explained natural history of uri and why it's necessary in patients at risk to treat GERD aggressively  at least  short term   to reduce risk of evolving cyclical cough initially  triggered by epithelial injury and a heightened sensitivty to the effects of any upper airway irritants,  most importantly acid - related.  That is, the more sensitive the epithelium damaged for virus, the more the cough, the more the secondary reflux (especially in those prone to reflux) the more the irritation of the sensitive mucosa and so on in a cyclical pattern.  See instructions for specific recommendations which were reviewed directly with the patient who was given a copy with highlighter outlining the key components.

## 2013-06-19 ENCOUNTER — Other Ambulatory Visit (HOSPITAL_COMMUNITY): Payer: Self-pay | Admitting: Obstetrics and Gynecology

## 2013-06-22 ENCOUNTER — Ambulatory Visit: Payer: Federal, State, Local not specified - PPO | Admitting: Gastroenterology

## 2013-06-22 ENCOUNTER — Ambulatory Visit: Payer: Self-pay | Admitting: Gastroenterology

## 2013-11-13 ENCOUNTER — Other Ambulatory Visit: Payer: Self-pay | Admitting: Internal Medicine

## 2013-11-13 DIAGNOSIS — R51 Headache: Principal | ICD-10-CM

## 2013-11-13 DIAGNOSIS — R200 Anesthesia of skin: Secondary | ICD-10-CM

## 2013-11-13 DIAGNOSIS — R519 Headache, unspecified: Secondary | ICD-10-CM

## 2013-11-19 ENCOUNTER — Other Ambulatory Visit: Payer: Federal, State, Local not specified - PPO

## 2013-11-19 ENCOUNTER — Ambulatory Visit
Admission: RE | Admit: 2013-11-19 | Discharge: 2013-11-19 | Disposition: A | Discharge status: Home / Self Care | Payer: Federal, State, Local not specified - PPO | Source: Ambulatory Visit | Attending: Internal Medicine | Admitting: Internal Medicine

## 2013-11-19 DIAGNOSIS — R519 Headache, unspecified: Secondary | ICD-10-CM

## 2013-11-19 DIAGNOSIS — R51 Headache: Principal | ICD-10-CM

## 2013-11-19 DIAGNOSIS — R200 Anesthesia of skin: Secondary | ICD-10-CM

## 2013-11-27 ENCOUNTER — Other Ambulatory Visit: Payer: Self-pay | Admitting: Internal Medicine

## 2013-11-27 DIAGNOSIS — K118 Other diseases of salivary glands: Secondary | ICD-10-CM

## 2013-12-03 ENCOUNTER — Other Ambulatory Visit: Payer: Federal, State, Local not specified - PPO

## 2013-12-03 ENCOUNTER — Ambulatory Visit
Admission: RE | Admit: 2013-12-03 | Discharge: 2013-12-03 | Disposition: A | Discharge status: Home / Self Care | Payer: Federal, State, Local not specified - PPO | Source: Ambulatory Visit | Attending: Internal Medicine | Admitting: Internal Medicine

## 2013-12-03 DIAGNOSIS — K118 Other diseases of salivary glands: Secondary | ICD-10-CM

## 2013-12-03 MED ORDER — GADOBENATE DIMEGLUMINE 529 MG/ML IV SOLN
18.0000 mL | Freq: Once | INTRAVENOUS | Status: AC | PRN
  Administered 2013-12-03: 18 mL via INTRAVENOUS

## 2014-06-04 ENCOUNTER — Telehealth: Payer: Self-pay | Admitting: Gastroenterology

## 2014-06-04 NOTE — Telephone Encounter (Signed)
Patient reports that she has had 5 days of rectal pain.  She reports that she only has this pain with position changes.  Specifically she denies rectal bleeding, itching, hemorrhoids,  or pain with defecation.  She went on to tell me that she does have some constipation, but that started after she began a diet called the "Next 56 days". Very high protein and very low carbohydrates.  She also has been doing "very vigorous work out"  Similar to a cross fit but "not quite as bad".  She is not sure if the pain started with the new workout , but she recently started the work out about a week and half ago.  We discussed that she should start Miralax 1-2 times a day and titrate for results.  She is advised that since the pain is present with only position changes specifically standing up from a seated position and bending over that this sounds musculoskeletal.  She is going for her annual physical this am with her primary care.  She is advised to call us back if he examines her and he feels this could be GI related.

## 2014-08-14 ENCOUNTER — Encounter: Payer: Self-pay | Admitting: Gastroenterology

## 2014-12-19 ENCOUNTER — Ambulatory Visit
Admission: RE | Admit: 2014-12-19 | Discharge: 2014-12-19 | Disposition: A | Discharge status: Home / Self Care | Payer: Federal, State, Local not specified - PPO | Source: Ambulatory Visit | Attending: Obstetrics and Gynecology | Admitting: Obstetrics and Gynecology

## 2014-12-19 ENCOUNTER — Other Ambulatory Visit: Payer: Self-pay | Admitting: Obstetrics and Gynecology

## 2014-12-19 DIAGNOSIS — R928 Other abnormal and inconclusive findings on diagnostic imaging of breast: Secondary | ICD-10-CM

## 2014-12-25 ENCOUNTER — Other Ambulatory Visit: Payer: Federal, State, Local not specified - PPO

## 2015-02-12 ENCOUNTER — Ambulatory Visit (INDEPENDENT_AMBULATORY_CARE_PROVIDER_SITE_OTHER): Payer: Federal, State, Local not specified - PPO | Admitting: Cardiovascular Disease

## 2015-02-12 ENCOUNTER — Encounter: Payer: Self-pay | Admitting: Cardiovascular Disease

## 2015-02-12 VITALS — BP 148/100 | HR 72 | Ht 63.0 in | Wt 169.0 lb

## 2015-02-12 DIAGNOSIS — R002 Palpitations: Secondary | ICD-10-CM | POA: Diagnosis not present

## 2015-02-12 DIAGNOSIS — I159 Secondary hypertension, unspecified: Secondary | ICD-10-CM | POA: Diagnosis not present

## 2015-02-12 NOTE — Assessment & Plan Note (Signed)
Ms. [name] Horn returns today for follow-up of palpitations. I saw her approximately 3 years ago for similar issues. She had an event monitor that time showed PVCs. 2-D echo performed in 2008 showed trace MR but otherwise was normal. She does admit to drinking one cup of coffee a day and is under a lot of stress both at work and at home. I also thought that there was an element of primum menopause several years ago. We have talked about limiting caffeine intake and addressing some of her stressors. I will see her back in 3 months for follow-up. At this time, I do not feel compelled to order an echo or another event monitor.

## 2015-02-12 NOTE — Patient Instructions (Signed)
Medication Instructions:  Your physician recommends that you continue on your current medications as directed. Please refer to the Current Medication list given to you today.   Labwork: none  Testing/Procedures: none  Follow-Up: Your physician recommends that you schedule a follow-up appointment in: 3 months with Dr. Berry.    Any Other Special Instructions Will Be Listed Below (If Applicable).     If you need a refill on your cardiac medications before your next appointment, please call your pharmacy.   

## 2015-02-12 NOTE — Progress Notes (Signed)
02/12/2015 Benedetto Coons Wendie Agreste   1960/09/27  [id number]  Primary Physician Jerlyn Ly, MD Primary Cardiologist: Lorretta Harp MD Renae Gloss   HPI:  [name] Horn is a 55 year old mildly overweight married Caucasian female mother of 2 children who works as an Associate Professor. She was referred by Dr. Joylene Draft for cardiology follow-up because of palpitations. She basically has no chronic risk factors. When I saw her over 3 years ago I thought her palpitations were related to stress, caffeine and potentially pre-menopause. [name] had a remote 2-D echo 2008 was essentially normal. An event monitor showed PVCs. She did cut back her caffeine intake from 2 to one cup a day. She does admit to stress with at work and at home. Over the last several months her palpitations have become more frequent and noticeable off and on a daily basis. She has had an occasional episode of "presyncope".   Current Outpatient Prescriptions  Medication Sig Dispense Refill  . ALPRAZolam (XANAX) 0.5 MG tablet 0.25 mg as needed.     . Ascorbic Acid (VITAMIN C) 500 MG tablet Take 500 mg by mouth daily.      Marland Kitchen EPIPEN 2-PAK 0.3 MG/0.3ML DEVI as needed.     Marland Kitchen ibuprofen (ADVIL,MOTRIN) 200 MG tablet Take 200 mg by mouth every 6 (six) hours as needed.      Marland Kitchen levocetirizine (XYZAL) 5 MG tablet Take 2.5 mg by mouth daily as needed.     . Multiple Vitamin (MULTIVITAMIN PO) Take 500 mcg by mouth daily.      No current facility-administered medications for this visit.    Allergies  Allergen Reactions  . Aspirin     wheezing  . Sulfa Antibiotics     Social History   Social History  . Marital Status: Married    Spouse Name: N/A  . Number of Children: N/A  . Years of Education: N/A   Occupational History  . Educational psychologist    Social History Main Topics  . Smoking status: Never Smoker   . Smokeless tobacco: Never Used  . Alcohol Use: Yes     Comment: socially, 1 beer once a week.  . Drug Use: No  .  Sexual Activity: Not on file   Other Topics Concern  . Not on file   Social History Narrative     Review of Systems: General: negative for chills, fever, night sweats or weight changes.  Cardiovascular: negative for chest pain, dyspnea on exertion, edema, orthopnea, palpitations, paroxysmal nocturnal dyspnea or shortness of breath Dermatological: negative for rash Respiratory: negative for cough or wheezing Urologic: negative for hematuria Abdominal: negative for nausea, vomiting, diarrhea, bright red blood per rectum, melena, or hematemesis Neurologic: negative for visual changes, syncope, or dizziness All other systems reviewed and are otherwise negative except as noted above.    Blood pressure 148/100, pulse 72, height 5\' 3"  (1.6 m), weight 169 lb (76.658 kg), last menstrual period 02/07/2011.  General appearance: alert and no distress Neck: no adenopathy, no carotid bruit, no JVD, supple, symmetrical, trachea midline and thyroid not enlarged, symmetric, no tenderness/mass/nodules Lungs: clear to auscultation bilaterally Heart: regular rate and rhythm, S1, S2 normal, no murmur, click, rub or gallop Extremities: extremities normal, atraumatic, no cyanosis or edema  EKG sinus rhythm at 72 with a ST or T-wave changes. I personally reviewed this EKG  ASSESSMENT AND PLAN:   Palpitations [name]. Griffiths returns today for follow-up of palpitations. I saw her approximately 3 years ago for similar issues.  She had an event monitor that time showed PVCs. 2-D echo performed in 2008 showed trace MR but otherwise was normal. She does admit to drinking one cup of coffee a day and is under a lot of stress both at work and at home. I also thought that there was an element of primum menopause several years ago. We have talked about limiting caffeine intake and addressing some of her stressors. I will see her back in 3 months for follow-up. At this time, I do not feel compelled to order an echo or  another event monitor.      Lorretta Harp MD FACP,FACC,FAHA, [facility] 02/12/2015 10:52 AM

## 2015-02-14 ENCOUNTER — Telehealth: Payer: Self-pay | Admitting: Cardiovascular Disease

## 2015-02-14 NOTE — Telephone Encounter (Signed)
New message     Pt says her heart is "pounding" fast when she exert herself.  No other symptoms. Please advise

## 2015-02-14 NOTE — Telephone Encounter (Signed)
Returned call to patient. Discussed symptoms which are similar to what she has had in the past and what she elicited at her appt on 1/31.  She has had the palpitations alone. They seem to come with stress or exertion. She has some other mild symptoms she associates w/ known anxiety. Pt does identify a big project she has coming up due in 4 weeks.  We discussed coping strategies for these in detail. Discussed modifications of exercise, diet, simple relaxation interventions, etc.  Pt aware to call if she has unique/new symptoms that she feels a need to address.  She voiced thanks and understanding.

## 2015-03-21 ENCOUNTER — Encounter: Payer: Self-pay | Admitting: Gastroenterology

## 2015-04-12 ENCOUNTER — Telehealth: Payer: Self-pay | Admitting: Gastroenterology

## 2015-04-12 DIAGNOSIS — R195 Other fecal abnormalities: Secondary | ICD-10-CM

## 2015-04-12 NOTE — Telephone Encounter (Signed)
OK to get O&P. See last office visit note. It is very likely she is seeing mucous.

## 2015-04-12 NOTE — Telephone Encounter (Signed)
Patient reports that for the last several days patient reports white "thread like" material in the stool.  She reports that this has happened off and on for the last few months.  Patient reports that they look like worms.  Patient denies any foreign travel, abdominal pain, rectal bleeding, diarrhea, constipation, weight loss, or other complaints.  Dr. Fuller Plan would you like to get an O&P?  She asks if she can be tested for parasites.  Please advise

## 2015-04-12 NOTE — Telephone Encounter (Signed)
Patient notified  She will come pick up the container

## 2015-05-15 ENCOUNTER — Ambulatory Visit (INDEPENDENT_AMBULATORY_CARE_PROVIDER_SITE_OTHER): Payer: Federal, State, Local not specified - PPO | Admitting: Cardiovascular Disease

## 2015-05-15 ENCOUNTER — Encounter: Payer: Self-pay | Admitting: Cardiovascular Disease

## 2015-05-15 VITALS — BP 132/84 | HR 80 | Ht 63.0 in | Wt 176.0 lb

## 2015-05-15 DIAGNOSIS — R002 Palpitations: Secondary | ICD-10-CM

## 2015-05-15 NOTE — Assessment & Plan Note (Signed)
[name] Horn returns today for follow-up of palpitations. These are markedly improved over the last 3-4 months. [name] begun to exercise more and reduce her stress. She also has decreased her caffeine intake.at this point, I'm not inclined to perform any other testing or at any medications. I will see her back in one year for follow-up.

## 2015-05-15 NOTE — Patient Instructions (Signed)
Your physician recommends that you continue on your current medications as directed. Please refer to the Current Medication list given to you today.   Your physician wants you to follow-up in: 12 MONTHS WITH DR BERRY. You will receive a reminder letter in the mail two months in advance. If you don't receive a letter, please call our office to schedule the follow-up appointment.  

## 2015-05-15 NOTE — Progress Notes (Signed)
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 She was referred by Dr. Joylene Horn for cardiology follow-up because of palpitations. I last saw her in the office 02/12/15. She basically has no chronic risk factors. When I saw her over 3 years ago I thought her palpitations were related to stress, caffeine and potentially pre-menopause. [name] had a remote 2-D echo 2008 was essentially normal. An event monitor showed PVCs. She did cut back her caffeine intake from 2 to one cup a day. She does admit to stress with at work and at home. When I had seen her in the office in January her palpitations have gotten more pronounced over the last several months they've improved in frequency and severity. She says that she's having more fun, she is less stressed. She is drinking less caffeine and she is exercising.  Current Outpatient Prescriptions  Medication Sig Dispense Refill  . ALPRAZolam (XANAX) 0.5 MG tablet 0.25 mg as needed.     . Ascorbic Acid (VITAMIN C) 500 MG tablet Take 500 mg by mouth daily.      Marland Kitchen EPIPEN 2-PAK 0.3 MG/0.3ML DEVI as needed.     Marland Kitchen ibuprofen (ADVIL,MOTRIN) 200 MG tablet Take 200 mg by mouth every 6 (six) hours as needed.      Marland Kitchen levocetirizine (XYZAL) 5 MG tablet Take 2.5 mg by mouth daily as needed.     . Multiple Vitamin (MULTIVITAMIN PO) Take 500 mcg by mouth daily.      No current facility-administered medications for this visit.    Allergies  Allergen Reactions  . Aspirin     wheezing  . Sulfa Antibiotics     Social History   Social History  . Marital Status: Married    Spouse Name: N/A  . Number of Children: N/A  . Years of Education: N/A   Occupational History  . Educational psychologist    Social History Main Topics  . Smoking  status: Never Smoker   . Smokeless tobacco: Never Used  . Alcohol Use: Yes     Comment: socially, 1 beer once a week.  . Drug Use: No  . Sexual Activity: Not on file   Other Topics Concern  . Not on file   Social History Narrative     Review of Systems: General: negative for chills, fever, night sweats or weight changes.  Cardiovascular: negative for chest pain, dyspnea on exertion, edema, orthopnea, palpitations, paroxysmal nocturnal dyspnea or shortness of breath Dermatological: negative for rash Respiratory: negative for cough or wheezing Urologic: negative for hematuria Abdominal: negative for nausea, vomiting, diarrhea, bright red blood per rectum, melena, or hematemesis Neurologic: negative for visual changes, syncope, or dizziness All other systems reviewed and are otherwise negative except as noted above.    Blood pressure 132/84, pulse 80, height 5\' 3"  (1.6 m), weight 176 lb (79.833 kg), last menstrual period 02/07/2011.  General appearance: alert and no distress Neck: no adenopathy, no carotid bruit, no JVD, supple, symmetrical, trachea midline and thyroid not enlarged, symmetric, no tenderness/mass/nodules Lungs: clear to auscultation bilaterally Heart: regular rate and rhythm, S1, S2 normal, no murmur, click, rub or gallop Extremities: extremities normal, atraumatic, no cyanosis or edema  EKG not performed today  ASSESSMENT AND PLAN:   Palpitations [name] Horn returns today  for follow-up of palpitations. These are markedly improved over the last 3-4 months. [name] begun to exercise more and reduce her stress. She also has decreased her caffeine intake.at this point, I'm not inclined to perform any other testing or at any medications. I will see her back in one year for follow-up.      [name] Horn FACP,FACC,FAHA, [facility] 05/15/2015 4:13 PM

## 2015-10-30 ENCOUNTER — Encounter: Payer: Self-pay | Admitting: Gastroenterology

## 2015-11-06 DIAGNOSIS — D235 Other benign neoplasm of skin of trunk: Secondary | ICD-10-CM | POA: Diagnosis not present

## 2015-11-06 DIAGNOSIS — D2262 Melanocytic nevi of left upper limb, including shoulder: Secondary | ICD-10-CM | POA: Diagnosis not present

## 2015-11-06 DIAGNOSIS — D485 Neoplasm of uncertain behavior of skin: Secondary | ICD-10-CM | POA: Diagnosis not present

## 2015-11-06 DIAGNOSIS — L308 Other specified dermatitis: Secondary | ICD-10-CM | POA: Diagnosis not present

## 2015-11-06 DIAGNOSIS — D2239 Melanocytic nevi of other parts of face: Secondary | ICD-10-CM | POA: Diagnosis not present

## 2015-11-06 DIAGNOSIS — D225 Melanocytic nevi of trunk: Secondary | ICD-10-CM | POA: Diagnosis not present

## 2015-11-11 ENCOUNTER — Telehealth: Payer: Self-pay | Admitting: Gastroenterology

## 2015-11-11 NOTE — Telephone Encounter (Signed)
I explained to the patient that she has been tested for celiac in the past and that it was negative.  She is advised that unable to test for celiac disease during a colonoscopy. She will call back for any additional questions or concerns.

## 2015-11-12 DIAGNOSIS — F4321 Adjustment disorder with depressed mood: Secondary | ICD-10-CM | POA: Diagnosis not present

## 2015-11-19 DIAGNOSIS — K080 Exfoliation of teeth due to systemic causes: Secondary | ICD-10-CM | POA: Diagnosis not present

## 2015-11-25 DIAGNOSIS — N952 Postmenopausal atrophic vaginitis: Secondary | ICD-10-CM | POA: Diagnosis not present

## 2016-01-10 ENCOUNTER — Encounter: Payer: Federal, State, Local not specified - PPO | Admitting: Gastroenterology

## 2016-01-14 DIAGNOSIS — N952 Postmenopausal atrophic vaginitis: Secondary | ICD-10-CM | POA: Diagnosis not present

## 2016-01-15 ENCOUNTER — Other Ambulatory Visit: Payer: Self-pay | Admitting: Obstetrics and Gynecology

## 2016-01-15 DIAGNOSIS — Z1231 Encounter for screening mammogram for malignant neoplasm of breast: Secondary | ICD-10-CM

## 2016-01-17 ENCOUNTER — Ambulatory Visit
Admission: RE | Admit: 2016-01-17 | Discharge: 2016-01-17 | Disposition: A | Discharge status: Home / Self Care | Payer: Federal, State, Local not specified - PPO | Source: Ambulatory Visit | Attending: Obstetrics and Gynecology | Admitting: Obstetrics and Gynecology

## 2016-01-17 DIAGNOSIS — Z1231 Encounter for screening mammogram for malignant neoplasm of breast: Secondary | ICD-10-CM

## 2016-01-21 DIAGNOSIS — M25561 Pain in right knee: Secondary | ICD-10-CM | POA: Diagnosis not present

## 2016-02-11 DIAGNOSIS — M25561 Pain in right knee: Secondary | ICD-10-CM | POA: Diagnosis not present

## 2016-02-19 ENCOUNTER — Telehealth: Payer: Self-pay | Admitting: Gastroenterology

## 2016-02-19 NOTE — Telephone Encounter (Signed)
Patient notified

## 2016-02-19 NOTE — Telephone Encounter (Signed)
Patient reports "white threads" in her stool and would like to be checked for parasites.  She was going to do this a year ago and never came to pick up stool sample kit. She is coming for a colonoscopy pre-visit on Friday and wanted to pick up the kit.  OK to place order again for O&P?

## 2016-02-19 NOTE — Telephone Encounter (Signed)
OK for O & P

## 2016-02-20 DIAGNOSIS — M25561 Pain in right knee: Secondary | ICD-10-CM | POA: Diagnosis not present

## 2016-02-21 ENCOUNTER — Other Ambulatory Visit: Payer: Federal, State, Local not specified - PPO

## 2016-02-21 ENCOUNTER — Ambulatory Visit (AMBULATORY_SURGERY_CENTER): Payer: Self-pay

## 2016-02-21 VITALS — Ht 63.0 in | Wt 185.2 lb

## 2016-02-21 DIAGNOSIS — Z860100 Personal history of colon polyps, unspecified: Secondary | ICD-10-CM

## 2016-02-21 MED ORDER — SUPREP BOWEL PREP KIT 17.5-3.13-1.6 GM/177ML PO SOLN: 1.0000 | Freq: Once | ORAL | 0 refills | Status: AC

## 2016-02-21 NOTE — Progress Notes (Signed)
No allergies to eggs or soy Recent injury right knee but can lie on left side OK No past problems with anesthesia except PONV With general anesthesia Did get sick after this last time but pt admits to eating too heavy a meal following No home oxygen No diet meds  Declined emmi

## 2016-02-24 DIAGNOSIS — S83521D Sprain of posterior cruciate ligament of right knee, subsequent encounter: Secondary | ICD-10-CM | POA: Diagnosis not present

## 2016-03-06 ENCOUNTER — Encounter: Payer: Federal, State, Local not specified - PPO | Admitting: Gastroenterology

## 2016-03-25 ENCOUNTER — Encounter: Payer: Federal, State, Local not specified - PPO | Admitting: Gastroenterology

## 2016-03-27 DIAGNOSIS — H43812 Vitreous degeneration, left eye: Secondary | ICD-10-CM | POA: Diagnosis not present

## 2016-03-30 DIAGNOSIS — H43812 Vitreous degeneration, left eye: Secondary | ICD-10-CM | POA: Diagnosis not present

## 2016-04-03 DIAGNOSIS — H43811 Vitreous degeneration, right eye: Secondary | ICD-10-CM | POA: Diagnosis not present

## 2016-04-07 DIAGNOSIS — M25561 Pain in right knee: Secondary | ICD-10-CM | POA: Diagnosis not present

## 2016-04-13 DIAGNOSIS — H43812 Vitreous degeneration, left eye: Secondary | ICD-10-CM | POA: Diagnosis not present

## 2016-04-30 ENCOUNTER — Encounter: Payer: Federal, State, Local not specified - PPO | Admitting: Gastroenterology

## 2016-05-11 DIAGNOSIS — H43813 Vitreous degeneration, bilateral: Secondary | ICD-10-CM | POA: Diagnosis not present

## 2016-05-15 DIAGNOSIS — M25561 Pain in right knee: Secondary | ICD-10-CM | POA: Diagnosis not present

## 2016-05-18 ENCOUNTER — Encounter: Payer: Self-pay | Admitting: Gastroenterology

## 2016-06-01 ENCOUNTER — Encounter: Payer: Federal, State, Local not specified - PPO | Admitting: Gastroenterology

## 2016-07-28 ENCOUNTER — Ambulatory Visit (AMBULATORY_SURGERY_CENTER): Payer: Self-pay

## 2016-07-28 VITALS — Ht 63.0 in | Wt 199.6 lb

## 2016-07-28 DIAGNOSIS — Z800 Family history of malignant neoplasm of digestive organs: Secondary | ICD-10-CM

## 2016-07-28 DIAGNOSIS — Z86010 Personal history of colonic polyps: Secondary | ICD-10-CM

## 2016-07-28 MED ORDER — NA SULFATE-K SULFATE-MG SULF 17.5-3.13-1.6 GM/177ML PO SOLN: ORAL | 0 refills | Status: DC

## 2016-07-28 NOTE — Progress Notes (Signed)
Per pt, no allergies to soy or egg products.Pt not taking any weight loss meds or using  O2 at home.  Emmi video sent to pt's email. 

## 2016-07-30 ENCOUNTER — Encounter: Payer: Self-pay | Admitting: Gastroenterology

## 2016-08-07 ENCOUNTER — Ambulatory Visit (AMBULATORY_SURGERY_CENTER): Payer: Federal, State, Local not specified - PPO | Admitting: Gastroenterology

## 2016-08-07 ENCOUNTER — Encounter: Payer: Self-pay | Admitting: Gastroenterology

## 2016-08-07 VITALS — BP 134/69 | HR 68 | Temp 98.9°F | Resp 16 | Ht 63.0 in | Wt 199.0 lb

## 2016-08-07 DIAGNOSIS — Z8371 Family history of colonic polyps: Secondary | ICD-10-CM

## 2016-08-07 DIAGNOSIS — D122 Benign neoplasm of ascending colon: Secondary | ICD-10-CM | POA: Diagnosis not present

## 2016-08-07 DIAGNOSIS — Z86010 Personal history of colonic polyps: Secondary | ICD-10-CM | POA: Diagnosis not present

## 2016-08-07 DIAGNOSIS — Z1211 Encounter for screening for malignant neoplasm of colon: Secondary | ICD-10-CM | POA: Diagnosis not present

## 2016-08-07 MED ORDER — SODIUM CHLORIDE 0.9 % IV SOLN: 500.0000 mL | INTRAVENOUS | Status: DC

## 2016-08-07 NOTE — Patient Instructions (Signed)
YOU HAD AN ENDOSCOPIC PROCEDURE TODAY AT [facility]:   Refer to the procedure report that was given to you for any specific questions about what was found during the examination.  If the procedure report does not answer your questions, please call your gastroenterologist to clarify.  If you requested that your care partner not be given the details of your procedure findings, then the procedure report has been included in a sealed envelope for you to review at your convenience later.  YOU SHOULD EXPECT: Some feelings of bloating in the abdomen. Passage of more gas than usual.  Walking can help get rid of the air that was put into your GI tract during the procedure and reduce the bloating. If you had a lower endoscopy (such as a colonoscopy or flexible sigmoidoscopy) you may notice spotting of blood in your stool or on the toilet paper. If you underwent a bowel prep for your procedure, you may not have a normal bowel movement for a few days.  Please Note:  You might notice some irritation and congestion in your nose or some drainage.  This is from the oxygen used during your procedure.  There is no need for concern and it should clear up in a day or so.  SYMPTOMS TO REPORT IMMEDIATELY:   Following lower endoscopy (colonoscopy or flexible sigmoidoscopy):  Excessive amounts of blood in the stool  Significant tenderness or worsening of abdominal pains  Swelling of the abdomen that is new, acute  Fever of 100F or higher For urgent or emergent issues, a gastroenterologist can be reached at any hour by calling [phone].  DIET:  We do recommend a small meal at first, but then you may proceed to your regular diet.  Drink plenty of fluids but you should avoid alcoholic beverages for 24 hours.  ACTIVITY:  You should plan to take it easy for the rest of today and you should NOT DRIVE or use heavy machinery until tomorrow (because of the sedation medicines used during the test).     FOLLOW UP: Our staff will call the number listed on your records the next business day following your procedure to check on you and address any questions or concerns that you may have regarding the information given to you following your procedure. If we do not reach you, we will leave a message.  However, if you are feeling well and you are not experiencing any problems, there is no need to return our call.  We will assume that you have returned to your regular daily activities without incident.  If any biopsies were taken you will be contacted by phone or by letter within the next 1-3 weeks.  Please call us at [phone] if you have not heard about the biopsies in 3 weeks.   SIGNATURES/CONFIDENTIALITY: You and/or your care partner have signed paperwork which will be entered into your electronic medical record.  These signatures attest to the fact that that the information above on your After Visit Summary has been reviewed and is understood.  Full responsibility of the confidentiality of this discharge information lies with you and/or your care-partner.  Await pathology  Please read over handouts about polyps, diverticulosis and high fiber diets  Please continue your normal medications

## 2016-08-07 NOTE — Progress Notes (Signed)
Called to room to assist during endoscopic procedure.  Patient ID and intended procedure confirmed with present staff. Received instructions for my participation in the procedure from the performing physician.  

## 2016-08-07 NOTE — Progress Notes (Signed)
Pt denies changes in medial or surgical hx since PV.  She has a covered area to left forearm.  She is extremely nervous; requests husband to be back here with her while checking in.

## 2016-08-07 NOTE — Progress Notes (Signed)
To PACU VSS. Report to [email or web address] 

## 2016-08-07 NOTE — Op Note (Signed)
[place] Patient Name: [name] Horn Procedure Date: 08/07/2016 11:38 AM MRN: [id number] Endoscopist: Ladene Artist , MD Age: 56 Referring MD:  Date of Birth: 1960/03/22 Gender: Female Account #: [id number] Procedure:                Colonoscopy Indications:              Surveillance: Personal history of adenomatous                            polyps on last colonoscopy > 5 years ago. Family                            history of colon polyps-mother. Medicines:                Monitored Anesthesia Care Procedure:                Pre-Anesthesia Assessment:                           - Prior to the procedure, a History and Physical                            was performed, and patient medications and                            allergies were reviewed. The patient's tolerance of                            previous anesthesia was also reviewed. The risks                            and benefits of the procedure and the sedation                            options and risks were discussed with the patient.                            All questions were answered, and informed consent                            was obtained. Prior Anticoagulants: The patient has                            taken no previous anticoagulant or antiplatelet                            agents. ASA Grade Assessment: II - A patient with                            mild systemic disease. After reviewing the risks                            and benefits, the patient was deemed in  satisfactory condition to undergo the procedure.                           After obtaining informed consent, the colonoscope                            was passed under direct vision. Throughout the                            procedure, the patient's blood pressure, pulse, and                            oxygen saturations were monitored continuously. The                            Model PCF-[id number])  scope was introduced                            through the anus and advanced to the the cecum,                            identified by appendiceal orifice and ileocecal                            valve. The ileocecal valve, appendiceal orifice,                            and rectum were photographed. The quality of the                            bowel preparation was excellent. The colonoscopy                            was performed without difficulty. The patient                            tolerated the procedure well. Scope In: 11:45:43 AM Scope Out: 11:57:37 AM Scope Withdrawal Time: 0 hours 8 minutes 55 seconds  Total Procedure Duration: 0 hours 11 minutes 54 seconds  Findings:                 The perianal and digital rectal examinations were                            normal.                           A 7 mm polyp was found in the ascending colon. The                            polyp was sessile. The polyp was removed with a                            cold snare. Resection and retrieval were complete.  Multiple small-mouthed diverticula were found in                            the left colon. There was no evidence of                            diverticular bleeding.                           The exam was otherwise without abnormality on                            direct and retroflexion views. Complications:            No immediate complications. Estimated blood loss:                            None. Estimated Blood Loss:     Estimated blood loss: none. Impression:               - One 7 mm polyp in the ascending colon, removed                            with a cold snare. Resected and retrieved.                           - Mild diverticulosis in the left colon. There was                            no evidence of diverticular bleeding.                           - The examination was otherwise normal on direct                            and retroflexion  views. Recommendation:           - Repeat colonoscopy in 5 years for surveillance.                           - Patient has a contact number available for                            emergencies. The signs and symptoms of potential                            delayed complications were discussed with the                            patient. Return to normal activities tomorrow.                            Written discharge instructions were provided to the                            patient.                           -  Resume previous diet.                           - Continue present medications.                           - Await pathology results. Ladene Artist, MD 08/07/2016 12:00:50 PM This report has been signed electronically.

## 2016-08-10 ENCOUNTER — Telehealth: Payer: Self-pay

## 2016-08-10 DIAGNOSIS — H43813 Vitreous degeneration, bilateral: Secondary | ICD-10-CM | POA: Diagnosis not present

## 2016-08-10 NOTE — Telephone Encounter (Signed)
  Follow up Call-  Call back number 08/07/2016  Post procedure Call Back phone  # [phone]  Permission to leave phone message Yes  Some recent data might be hidden     Patient questions:  Do you have a fever, pain , or abdominal swelling? No. Pain Score  0 *  Have you tolerated food without any problems? Yes.    Have you been able to return to your normal activities? Yes.    Do you have any questions about your discharge instructions: Diet   No. Medications  No. Follow up visit  No.  Do you have questions or concerns about your Care? No.  Actions: * If pain score is 4 or above: No action needed, pain <4.

## 2016-08-23 ENCOUNTER — Encounter: Payer: Self-pay | Admitting: Gastroenterology

## 2016-10-28 DIAGNOSIS — G43109 Migraine with aura, not intractable, without status migrainosus: Secondary | ICD-10-CM | POA: Diagnosis not present

## 2017-01-20 DIAGNOSIS — M79645 Pain in left finger(s): Secondary | ICD-10-CM | POA: Diagnosis not present

## 2017-01-20 DIAGNOSIS — M1812 Unilateral primary osteoarthritis of first carpometacarpal joint, left hand: Secondary | ICD-10-CM | POA: Diagnosis not present

## 2017-02-08 DIAGNOSIS — J029 Acute pharyngitis, unspecified: Secondary | ICD-10-CM | POA: Diagnosis not present

## 2017-02-08 DIAGNOSIS — J020 Streptococcal pharyngitis: Secondary | ICD-10-CM | POA: Diagnosis not present

## 2017-02-08 DIAGNOSIS — Z6837 Body mass index (BMI) 37.0-37.9, adult: Secondary | ICD-10-CM | POA: Diagnosis not present

## 2017-02-08 DIAGNOSIS — R5383 Other fatigue: Secondary | ICD-10-CM | POA: Diagnosis not present

## 2017-02-16 DIAGNOSIS — R432 Parageusia: Secondary | ICD-10-CM | POA: Diagnosis not present

## 2017-02-16 DIAGNOSIS — E507 Other ocular manifestations of vitamin A deficiency: Secondary | ICD-10-CM | POA: Diagnosis not present

## 2017-02-16 DIAGNOSIS — K117 Disturbances of salivary secretion: Secondary | ICD-10-CM | POA: Diagnosis not present

## 2017-02-16 DIAGNOSIS — J029 Acute pharyngitis, unspecified: Secondary | ICD-10-CM | POA: Diagnosis not present

## 2017-02-17 DIAGNOSIS — R682 Dry mouth, unspecified: Secondary | ICD-10-CM | POA: Diagnosis not present

## 2017-02-24 DIAGNOSIS — R682 Dry mouth, unspecified: Secondary | ICD-10-CM | POA: Diagnosis not present

## 2017-02-24 DIAGNOSIS — F419 Anxiety disorder, unspecified: Secondary | ICD-10-CM | POA: Diagnosis not present

## 2017-03-02 DIAGNOSIS — N898 Other specified noninflammatory disorders of vagina: Secondary | ICD-10-CM | POA: Diagnosis not present

## 2017-03-02 DIAGNOSIS — M3501 Sicca syndrome with keratoconjunctivitis: Secondary | ICD-10-CM | POA: Diagnosis not present

## 2017-03-05 DIAGNOSIS — R682 Dry mouth, unspecified: Secondary | ICD-10-CM | POA: Diagnosis not present

## 2017-03-11 DIAGNOSIS — F418 Other specified anxiety disorders: Secondary | ICD-10-CM | POA: Diagnosis not present

## 2017-03-18 ENCOUNTER — Encounter: Payer: Self-pay | Admitting: *Deleted

## 2017-03-18 ENCOUNTER — Other Ambulatory Visit: Payer: Self-pay

## 2017-03-19 DIAGNOSIS — F418 Other specified anxiety disorders: Secondary | ICD-10-CM | POA: Diagnosis not present

## 2017-03-23 NOTE — Discharge Instructions (Signed)
General Anesthesia, Adult, Care After °These instructions provide you with information about caring for yourself after your procedure. Your health care provider may also give you more specific instructions. Your treatment has been planned according to current medical practices, but problems sometimes occur. Call your health care provider if you have any problems or questions after your procedure. °What can I expect after the procedure? °After the procedure, it is common to have: °· Vomiting. °· A sore throat. °· Mental slowness. ° °It is common to feel: °· Nauseous. °· Cold or shivery. °· Sleepy. °· Tired. °· Sore or achy, even in parts of your body where you did not have surgery. ° °Follow these instructions at home: °For at least 24 hours after the procedure: °· Do not: °? Participate in activities where you could fall or become injured. °? Drive. °? Use heavy machinery. °? Drink alcohol. °? Take sleeping pills or medicines that cause drowsiness. °? Make important decisions or sign legal documents. °? Take care of children on your own. °· Rest. °Eating and drinking °· If you vomit, drink water, juice, or soup when you can drink without vomiting. °· Drink enough fluid to keep your urine clear or pale yellow. °· Make sure you have little or no nausea before eating solid foods. °· Follow the diet recommended by your health care provider. °General instructions °· Have a responsible adult stay with you until you are awake and alert. °· Return to your normal activities as told by your health care provider. Ask your health care provider what activities are safe for you. °· Take over-the-counter and prescription medicines only as told by your health care provider. °· If you smoke, do not smoke without supervision. °· Keep all follow-up visits as told by your health care provider. This is important. °Contact a health care provider if: °· You continue to have nausea or vomiting at home, and medicines are not helpful. °· You  cannot drink fluids or start eating again. °· You cannot urinate after 8-12 hours. °· You develop a skin rash. °· You have fever. °· You have increasing redness at the site of your procedure. °Get help right away if: °· You have difficulty breathing. °· You have chest pain. °· You have unexpected bleeding. °· You feel that you are having a life-threatening or urgent problem. °This information is not intended to replace advice given to you by your health care provider. Make sure you discuss any questions you have with your health care provider. °Document Released: 04/06/2000 Document Revised: 06/03/2015 Document Reviewed: 12/13/2014 °Elsevier Interactive Patient Education © 2018 [employer] ° °

## 2017-03-24 ENCOUNTER — Ambulatory Visit: Payer: Federal, State, Local not specified - PPO | Admitting: Anesthesiology

## 2017-03-24 ENCOUNTER — Encounter
Admission: RE | Disposition: A | Discharge status: Home / Self Care | Payer: Self-pay | Source: Ambulatory Visit | Attending: Otolaryngology

## 2017-03-24 ENCOUNTER — Ambulatory Visit
Admission: RE | Admit: 2017-03-24 | Discharge: 2017-03-24 | Disposition: A | Discharge status: Home / Self Care | Payer: Federal, State, Local not specified - PPO | Source: Ambulatory Visit | Attending: Otolaryngology | Admitting: Otolaryngology

## 2017-03-24 DIAGNOSIS — K117 Disturbances of salivary secretion: Secondary | ICD-10-CM | POA: Diagnosis not present

## 2017-03-24 DIAGNOSIS — G4730 Sleep apnea, unspecified: Secondary | ICD-10-CM | POA: Insufficient documentation

## 2017-03-24 DIAGNOSIS — R682 Dry mouth, unspecified: Secondary | ICD-10-CM | POA: Diagnosis not present

## 2017-03-24 DIAGNOSIS — F419 Anxiety disorder, unspecified: Secondary | ICD-10-CM | POA: Insufficient documentation

## 2017-03-24 DIAGNOSIS — K1123 Chronic sialoadenitis: Secondary | ICD-10-CM | POA: Diagnosis not present

## 2017-03-24 HISTORY — DX: Unspecified osteoarthritis, unspecified site: M19.90

## 2017-03-24 HISTORY — PX: EXCISION ORAL TUMOR: SHX6265

## 2017-03-24 SURGERY — EXCISION, NEOPLASM, MOUTH
Anesthesia: General | Site: Mouth | Wound class: Clean Contaminated

## 2017-03-24 MED ORDER — LACTATED RINGERS IV SOLN: INTRAVENOUS | Status: DC

## 2017-03-24 MED ORDER — LACTATED RINGERS IV SOLN
INTRAVENOUS | Status: DC
  Administered 2017-03-24: 10:00:00 via INTRAVENOUS

## 2017-03-24 MED ORDER — ONDANSETRON HCL 4 MG/2ML IJ SOLN: 4.0000 mg | Freq: Once | INTRAMUSCULAR | Status: DC | PRN

## 2017-03-24 MED ORDER — LIDOCAINE-EPINEPHRINE 1 %-1:100000 IJ SOLN
INTRAMUSCULAR | Status: DC | PRN
  Administered 2017-03-24: 1.5 mL

## 2017-03-24 MED ORDER — FENTANYL CITRATE (PF) 100 MCG/2ML IJ SOLN
INTRAMUSCULAR | Status: DC | PRN
  Administered 2017-03-24: 50 ug via INTRAVENOUS

## 2017-03-24 MED ORDER — ONDANSETRON HCL 4 MG/2ML IJ SOLN
INTRAMUSCULAR | Status: DC | PRN
Start: 2017-03-24 — End: 2017-03-24
  Administered 2017-03-24: 4 mg via INTRAVENOUS

## 2017-03-24 MED ORDER — LIDOCAINE HCL (CARDIAC) 20 MG/ML IV SOLN
INTRAVENOUS | Status: DC | PRN
  Administered 2017-03-24: 50 mg via INTRAVENOUS

## 2017-03-24 MED ORDER — ACETAMINOPHEN 10 MG/ML IV SOLN: 1000.0000 mg | Freq: Once | INTRAVENOUS | Status: DC | PRN

## 2017-03-24 MED ORDER — ONDANSETRON HCL 4 MG PO TABS: 4.0000 mg | ORAL_TABLET | Freq: Three times a day (TID) | ORAL | 0 refills | Status: DC | PRN

## 2017-03-24 MED ORDER — FENTANYL CITRATE (PF) 100 MCG/2ML IJ SOLN: 25.0000 ug | INTRAMUSCULAR | Status: DC | PRN

## 2017-03-24 MED ORDER — OXYCODONE HCL 5 MG/5ML PO SOLN: 5.0000 mg | Freq: Once | ORAL | Status: DC | PRN

## 2017-03-24 MED ORDER — PROPOFOL 500 MG/50ML IV EMUL
INTRAVENOUS | Status: DC | PRN
  Administered 2017-03-24: 75 ug/kg/min via INTRAVENOUS

## 2017-03-24 MED ORDER — OXYCODONE HCL 5 MG PO TABS: 5.0000 mg | ORAL_TABLET | Freq: Once | ORAL | Status: DC | PRN

## 2017-03-24 MED ORDER — MIDAZOLAM HCL 2 MG/2ML IJ SOLN
INTRAMUSCULAR | Status: DC | PRN
  Administered 2017-03-24: 2 mg via INTRAVENOUS

## 2017-03-24 SURGICAL SUPPLY — 21 items
BLADE SURG 15 STRL LF DISP TIS (BLADE) ×1 IMPLANT
BLADE SURG 15 STRL SS (BLADE) ×1
CANISTER SUCT 1200ML W/VALVE (MISCELLANEOUS) ×2 IMPLANT
CORD BIP STRL DISP 12FT (MISCELLANEOUS) ×2 IMPLANT
DRSG TELFA 4X3 1S NADH ST (GAUZE/BANDAGES/DRESSINGS) ×2 IMPLANT
ELECT REM PT RETURN 9FT ADLT (ELECTROSURGICAL) ×2
ELECTRODE REM PT RTRN 9FT ADLT (ELECTROSURGICAL) ×1 IMPLANT
GLOVE BIO SURGEON STRL SZ7.5 (GLOVE) ×2 IMPLANT
GOWN STRL REUS W/ TWL LRG LVL3 (GOWN DISPOSABLE) ×3 IMPLANT
GOWN STRL REUS W/TWL LRG LVL3 (GOWN DISPOSABLE) ×3
KIT TURNOVER KIT A (KITS) ×2 IMPLANT
NS IRRIG 500ML POUR BTL (IV SOLUTION) ×2 IMPLANT
PACK HEAD/NECK (MISCELLANEOUS) ×2 IMPLANT
PROBE MONO 100X0.75 ELECT 1.9M (MISCELLANEOUS) ×2 IMPLANT
SHEARS HARMONIC 9CM CVD (BLADE) ×2 IMPLANT
SPONGE XRAY 4X4 16PLY STRL (MISCELLANEOUS) ×2 IMPLANT
SUT CHROMIC 5 0 RB 1 27 (SUTURE) ×2 IMPLANT
SUT ETHILON 4-0 (SUTURE) ×1
SUT ETHILON 4-0 FS2 18XMFL BLK (SUTURE) ×1
SUTURE ETHLN 4-0 FS2 18XMF BLK (SUTURE) ×1 IMPLANT
SYSTEM CHEST DRAIN TLS 7FR (DRAIN) ×2 IMPLANT

## 2017-03-24 NOTE — H&P (Signed)
..  History and Physical paper copy reviewed and updated date of procedure and will be scanned into system.  Patient seen and examined.  

## 2017-03-24 NOTE — Anesthesia Procedure Notes (Addendum)
Date/Time: 03/24/2017 9:44 AM Performed by: Mayme Genta, CRNA Pre-anesthesia Checklist: Patient identified, Emergency Drugs available, Suction available, Timeout performed and Patient being monitored Patient Re-evaluated:Patient Re-evaluated prior to induction Oxygen Delivery Method: Simple face mask Placement Confirmation: positive ETCO2

## 2017-03-24 NOTE — Op Note (Signed)
..  03/24/2017  10:08 AM    [name] Horn  [id number]   Pre-Op Dx:  Xerostomia  Post-op Dx: Xerostomia  Proc:Minor Salivary Gland biopsy  Surg: [name]  Anes:  IV Sedation with Nasal cannula 02  EBL:  None  Comp:  None  Findings:  Approximately 6 minor salivary glands removed.  Small sensory nerve identified and protected.  Procedure: With the patient in a comfortable supine position,IV sedation with nasal cannula anesthesia was administered.  At an appropriate level,the patient's oral cavity was examined.  1.47ml of 1% lidocaine with 1:100,000 epinephrine was injected into the patient's left lower lip.  This was allowed to take effect for 3 to 5 minutes.  A chalazion retractor was brought onto the field and placed overlying an area of the left lower lip mucosa where palpable minor salivary glands were found.  A 15 blade scalpel was used to make an horizontal incision.  Blunt dissection was made surrounding minor salivary gland lobules.  A small sensory nerve was identified and protected.  Approximately 6 minor salivary gland lobules were removed without complication.  Bipolar electrocautery was used for hemostasis.   The wound was next closed in an interupted fashion with 5.0 chromic suture.  Following this  The patient was returned to anesthesia, awakened, and transferred to recovery in stable condition.  Dispo:  PACU to home  Plan: recheck in my office in 1 week.Marland Kitchen   [name] 10:08 AM 03/24/2017

## 2017-03-24 NOTE — Transfer of Care (Signed)
Immediate Anesthesia Transfer of Care Note  Patient: [name] Horn  Procedure(s) Performed: MINOR SALIVARY GLAND BIOSPY (N/A )  Patient Location: PACU  Anesthesia Type: General  Level of Consciousness: awake, alert  and patient cooperative  Airway and Oxygen Therapy: Patient Spontanous Breathing and Patient connected to supplemental oxygen  Post-op Assessment: Post-op Vital signs reviewed, Patient's Cardiovascular Status Stable, Respiratory Function Stable, Patent Airway and No signs of Nausea or vomiting  Post-op Vital Signs: Reviewed and stable  Complications: No apparent anesthesia complications

## 2017-03-24 NOTE — Anesthesia Procedure Notes (Signed)
Date/Time: 03/24/2017 9:49 AM Performed by: Mayme Genta, CRNA Pre-anesthesia Checklist: Patient identified, Emergency Drugs available, Suction available, Timeout performed and Patient being monitored Patient Re-evaluated:Patient Re-evaluated prior to induction Oxygen Delivery Method: Nasal cannula Placement Confirmation: positive ETCO2

## 2017-03-24 NOTE — Anesthesia Postprocedure Evaluation (Signed)
Anesthesia Post Note  Patient: [name] Horn  Procedure(s) Performed: MINOR SALIVARY GLAND BIOSPY (N/A Mouth)  Patient location during evaluation: PACU Anesthesia Type: General Level of consciousness: awake and alert, oriented and patient cooperative Pain management: pain level controlled Vital Signs Assessment: post-procedure vital signs reviewed and stable Respiratory status: spontaneous breathing, nonlabored ventilation and respiratory function stable Cardiovascular status: blood pressure returned to baseline and stable Postop Assessment: adequate PO intake Anesthetic complications: no    Darrin Nipper

## 2017-03-24 NOTE — Anesthesia Preprocedure Evaluation (Signed)
Anesthesia Evaluation  Patient identified by MRN, date of birth, ID band Patient awake    Reviewed: Allergy & Precautions, NPO status , Patient's Chart, lab work & pertinent test results  History of Anesthesia Complications (+) PONV and history of anesthetic complications ("very sensitive to anesthesia")  Airway Mallampati: III  TM Distance: >3 FB Neck ROM: Full    Dental  (+)    Pulmonary asthma , sleep apnea ,    Pulmonary exam normal breath sounds clear to auscultation       Cardiovascular Exercise Tolerance: Good Normal cardiovascular exam Rhythm:Regular Rate:Normal  PVCs   Neuro/Psych PSYCHIATRIC DISORDERS Anxiety negative neurological ROS     GI/Hepatic GERD  ,  Endo/Other  negative endocrine ROS  Renal/GU negative Renal ROS     Musculoskeletal  (+) Arthritis , Osteoarthritis,    Abdominal   Peds  Hematology  (+) Blood dyscrasia, anemia ,   Anesthesia Other Findings   Reproductive/Obstetrics                             Anesthesia Physical Anesthesia Plan  ASA: II  Anesthesia Plan: General   Post-op Pain Management:    Induction: Intravenous  PONV Risk Score and Plan: 2 and Propofol infusion and TIVA  Airway Management Planned: Natural Airway  Additional Equipment:   Intra-op Plan:   Post-operative Plan: Extubation in OR  Informed Consent: I have reviewed the patients History and Physical, chart, labs and discussed the procedure including the risks, benefits and alternatives for the proposed anesthesia with the patient or authorized representative who has indicated his/her understanding and acceptance.     Plan Discussed with: CRNA  Anesthesia Plan Comments:         Anesthesia Quick Evaluation

## 2017-03-30 LAB — SURGICAL PATHOLOGY

## 2017-06-04 DIAGNOSIS — D2310 Other benign neoplasm of skin of unspecified eyelid, including canthus: Secondary | ICD-10-CM | POA: Diagnosis not present

## 2017-06-21 DIAGNOSIS — F422 Mixed obsessional thoughts and acts: Secondary | ICD-10-CM | POA: Diagnosis not present

## 2017-08-13 ENCOUNTER — Encounter (HOSPITAL_BASED_OUTPATIENT_CLINIC_OR_DEPARTMENT_OTHER): Payer: Self-pay

## 2017-08-13 DIAGNOSIS — G4733 Obstructive sleep apnea (adult) (pediatric): Secondary | ICD-10-CM

## 2017-09-03 ENCOUNTER — Encounter (HOSPITAL_BASED_OUTPATIENT_CLINIC_OR_DEPARTMENT_OTHER): Payer: Federal, State, Local not specified - PPO

## 2017-09-22 DIAGNOSIS — L281 Prurigo nodularis: Secondary | ICD-10-CM | POA: Diagnosis not present

## 2017-09-22 DIAGNOSIS — D485 Neoplasm of uncertain behavior of skin: Secondary | ICD-10-CM | POA: Diagnosis not present

## 2017-09-22 DIAGNOSIS — D2362 Other benign neoplasm of skin of left upper limb, including shoulder: Secondary | ICD-10-CM | POA: Diagnosis not present

## 2017-09-22 DIAGNOSIS — L858 Other specified epidermal thickening: Secondary | ICD-10-CM | POA: Diagnosis not present

## 2017-09-22 DIAGNOSIS — L2089 Other atopic dermatitis: Secondary | ICD-10-CM | POA: Diagnosis not present

## 2017-09-22 DIAGNOSIS — D2262 Melanocytic nevi of left upper limb, including shoulder: Secondary | ICD-10-CM | POA: Diagnosis not present

## 2017-10-15 ENCOUNTER — Other Ambulatory Visit: Payer: Self-pay | Admitting: Internal Medicine

## 2017-10-15 ENCOUNTER — Encounter (HOSPITAL_BASED_OUTPATIENT_CLINIC_OR_DEPARTMENT_OTHER): Payer: Federal, State, Local not specified - PPO

## 2017-10-15 DIAGNOSIS — Z1231 Encounter for screening mammogram for malignant neoplasm of breast: Secondary | ICD-10-CM

## 2017-11-10 DIAGNOSIS — K080 Exfoliation of teeth due to systemic causes: Secondary | ICD-10-CM | POA: Diagnosis not present

## 2017-12-14 ENCOUNTER — Ambulatory Visit: Payer: Federal, State, Local not specified - PPO

## 2018-01-07 DIAGNOSIS — H2513 Age-related nuclear cataract, bilateral: Secondary | ICD-10-CM | POA: Diagnosis not present

## 2018-01-12 DIAGNOSIS — R911 Solitary pulmonary nodule: Secondary | ICD-10-CM

## 2018-01-12 HISTORY — DX: Solitary pulmonary nodule: R91.1

## 2018-01-14 ENCOUNTER — Encounter (HOSPITAL_BASED_OUTPATIENT_CLINIC_OR_DEPARTMENT_OTHER): Payer: Federal, State, Local not specified - PPO

## 2018-01-21 ENCOUNTER — Ambulatory Visit
Admission: RE | Admit: 2018-01-21 | Discharge: 2018-01-21 | Disposition: A | Discharge status: Home / Self Care | Payer: Federal, State, Local not specified - PPO | Source: Ambulatory Visit | Attending: Internal Medicine | Admitting: Internal Medicine

## 2018-01-21 ENCOUNTER — Encounter (INDEPENDENT_AMBULATORY_CARE_PROVIDER_SITE_OTHER): Payer: Self-pay

## 2018-01-21 DIAGNOSIS — Z1231 Encounter for screening mammogram for malignant neoplasm of breast: Secondary | ICD-10-CM | POA: Diagnosis not present

## 2018-01-26 DIAGNOSIS — Z6833 Body mass index (BMI) 33.0-33.9, adult: Secondary | ICD-10-CM | POA: Diagnosis not present

## 2018-01-26 DIAGNOSIS — Z130 Encounter for screening for diseases of the blood and blood-forming organs and certain disorders involving the immune mechanism: Secondary | ICD-10-CM | POA: Diagnosis not present

## 2018-01-26 DIAGNOSIS — Z1389 Encounter for screening for other disorder: Secondary | ICD-10-CM | POA: Diagnosis not present

## 2018-01-26 DIAGNOSIS — Z01419 Encounter for gynecological examination (general) (routine) without abnormal findings: Secondary | ICD-10-CM | POA: Diagnosis not present

## 2018-01-27 ENCOUNTER — Other Ambulatory Visit: Payer: Self-pay

## 2018-01-27 ENCOUNTER — Encounter (HOSPITAL_COMMUNITY): Payer: Self-pay | Admitting: *Deleted

## 2018-01-27 ENCOUNTER — Telehealth: Payer: Self-pay | Admitting: Gastroenterology

## 2018-01-27 DIAGNOSIS — F419 Anxiety disorder, unspecified: Secondary | ICD-10-CM | POA: Insufficient documentation

## 2018-01-27 DIAGNOSIS — K5732 Diverticulitis of large intestine without perforation or abscess without bleeding: Secondary | ICD-10-CM | POA: Insufficient documentation

## 2018-01-27 DIAGNOSIS — R1030 Lower abdominal pain, unspecified: Secondary | ICD-10-CM | POA: Diagnosis not present

## 2018-01-27 DIAGNOSIS — J45909 Unspecified asthma, uncomplicated: Secondary | ICD-10-CM | POA: Insufficient documentation

## 2018-01-27 LAB — URINALYSIS, ROUTINE W REFLEX MICROSCOPIC
Bilirubin Urine: NEGATIVE
Glucose, UA: NEGATIVE mg/dL
Hgb urine dipstick: NEGATIVE
KETONES UR: NEGATIVE mg/dL
NITRITE: NEGATIVE
PROTEIN: NEGATIVE mg/dL
Specific Gravity, Urine: 1.014 (ref 1.005–1.030)
pH: 9 — ABNORMAL HIGH (ref 5.0–8.0)

## 2018-01-27 MED ORDER — SODIUM CHLORIDE 0.9% FLUSH
3.0000 mL | Freq: Once | INTRAVENOUS | Status: AC
  Administered 2018-01-28: 3 mL via INTRAVENOUS

## 2018-01-27 NOTE — Telephone Encounter (Signed)
Pt called stating that  She has been having lower abd pain for about three day, wants to know if it is related to her condition. Pls call her.

## 2018-01-27 NOTE — ED Triage Notes (Addendum)
Pt reports lower abd pain x 4 days with rectal pressure.  She also endorses sharp pain in her lower abd when urinating but denies dysuria.  She went to her GYN yesterday for her annual exam, had her urine checked and it was negative for UTI.  She also reports abd distention and states that she has not had a regular BM.  LBM was today.  Hx of diveritculosis.  Pain noted in LLQ when palpating RLQ.

## 2018-01-27 NOTE — Telephone Encounter (Signed)
Patient reports pain in her lower abdomen bilaterally.  She reports pain with position changes, ambulating, urination, and some rectal discomfort. She saw her GYN yesterday and they did not feel was GYN related and encouraged her to see GI.  She will come for OV tomorrow with Ellouise Newer, PA at 1:45

## 2018-01-28 ENCOUNTER — Ambulatory Visit: Payer: Federal, State, Local not specified - PPO | Admitting: Physician Assistant

## 2018-01-28 ENCOUNTER — Encounter (HOSPITAL_COMMUNITY): Payer: Self-pay

## 2018-01-28 ENCOUNTER — Emergency Department (HOSPITAL_COMMUNITY): Payer: Federal, State, Local not specified - PPO

## 2018-01-28 ENCOUNTER — Emergency Department (HOSPITAL_COMMUNITY)
Admission: EM | Admit: 2018-01-28 | Discharge: 2018-01-28 | Disposition: A | Discharge status: Home / Self Care | Payer: Federal, State, Local not specified - PPO | Attending: Emergency Medicine | Admitting: Emergency Medicine

## 2018-01-28 DIAGNOSIS — K5732 Diverticulitis of large intestine without perforation or abscess without bleeding: Secondary | ICD-10-CM

## 2018-01-28 LAB — CBC WITH DIFFERENTIAL/PLATELET
ABS IMMATURE GRANULOCYTES: 0.07 10*3/uL (ref 0.00–0.07)
Basophils Absolute: 0 10*3/uL (ref 0.0–0.1)
Basophils Relative: 0 %
EOS ABS: 0.2 10*3/uL (ref 0.0–0.5)
Eosinophils Relative: 2 %
HCT: 39.3 % (ref 36.0–46.0)
Hemoglobin: 12.2 g/dL (ref 12.0–15.0)
IMMATURE GRANULOCYTES: 1 %
LYMPHS ABS: 2.3 10*3/uL (ref 0.7–4.0)
LYMPHS PCT: 18 %
MCH: 27.5 pg (ref 26.0–34.0)
MCHC: 31 g/dL (ref 30.0–36.0)
MCV: 88.5 fL (ref 80.0–100.0)
Monocytes Absolute: 0.8 10*3/uL (ref 0.1–1.0)
Monocytes Relative: 6 %
NEUTROS PCT: 73 %
Neutro Abs: 9.3 10*3/uL — ABNORMAL HIGH (ref 1.7–7.7)
PLATELETS: 319 10*3/uL (ref 150–400)
RBC: 4.44 MIL/uL (ref 3.87–5.11)
RDW: 13.1 % (ref 11.5–15.5)
WBC: 12.8 10*3/uL — ABNORMAL HIGH (ref 4.0–10.5)
nRBC: 0 % (ref 0.0–0.2)

## 2018-01-28 LAB — COMPREHENSIVE METABOLIC PANEL
ALBUMIN: 4.1 g/dL (ref 3.5–5.0)
ALK PHOS: 77 U/L (ref 38–126)
ALT: 15 U/L (ref 0–44)
AST: 19 U/L (ref 15–41)
Anion gap: 10 (ref 5–15)
BUN: 10 mg/dL (ref 6–20)
CALCIUM: 9 mg/dL (ref 8.9–10.3)
CO2: 26 mmol/L (ref 22–32)
Chloride: 103 mmol/L (ref 98–111)
Creatinine, Ser: 0.72 mg/dL (ref 0.44–1.00)
GFR calc Af Amer: >60 mL/min (ref >60)
GFR calc non Af Amer: >60 mL/min (ref >60)
Glucose, Bld: 122 mg/dL — ABNORMAL HIGH (ref 70–99)
Potassium: 4.4 mmol/L (ref 3.5–5.1)
SODIUM: 139 mmol/L (ref 135–145)
Total Bilirubin: 0.9 mg/dL (ref 0.3–1.2)
Total Protein: 7.4 g/dL (ref 6.5–8.1)

## 2018-01-28 LAB — LIPASE, BLOOD: Lipase: 57 U/L — ABNORMAL HIGH (ref 11–51)

## 2018-01-28 MED ORDER — SODIUM CHLORIDE (PF) 0.9 % IJ SOLN
INTRAMUSCULAR | Status: AC
  Filled 2018-01-28: qty 50

## 2018-01-28 MED ORDER — METRONIDAZOLE IN NACL 5-0.79 MG/ML-% IV SOLN
500.0000 mg | Freq: Once | INTRAVENOUS | Status: AC
  Administered 2018-01-28: 500 mg via INTRAVENOUS
  Filled 2018-01-28: qty 100

## 2018-01-28 MED ORDER — AMOXICILLIN-POT CLAVULANATE 875-125 MG PO TABS: 1.0000 | ORAL_TABLET | Freq: Two times a day (BID) | ORAL | 0 refills | Status: DC

## 2018-01-28 MED ORDER — HYDROCODONE-ACETAMINOPHEN 5-325 MG PO TABS: 1.0000 | ORAL_TABLET | Freq: Four times a day (QID) | ORAL | 0 refills | Status: DC | PRN

## 2018-01-28 MED ORDER — SODIUM CHLORIDE 0.9 % IV SOLN
2.0000 g | Freq: Once | INTRAVENOUS | Status: AC
  Administered 2018-01-28: 2 g via INTRAVENOUS
  Filled 2018-01-28: qty 20

## 2018-01-28 MED ORDER — IOPAMIDOL (ISOVUE-300) INJECTION 61%
100.0000 mL | Freq: Once | INTRAVENOUS | Status: AC | PRN
  Administered 2018-01-28: 100 mL via INTRAVENOUS

## 2018-01-28 MED ORDER — LORAZEPAM 2 MG/ML IJ SOLN
1.0000 mg | Freq: Once | INTRAMUSCULAR | Status: AC | PRN
  Administered 2018-01-28: 1 mg via INTRAVENOUS
  Filled 2018-01-28: qty 1

## 2018-01-28 MED ORDER — IOPAMIDOL (ISOVUE-300) INJECTION 61%
INTRAVENOUS | Status: AC
  Filled 2018-01-28: qty 100

## 2018-01-28 NOTE — Telephone Encounter (Signed)
Patient cancelled appt for today 1.17.2020 with APP Anderson Malta due to going to the ER last night. Patient also states during a recent CT they found a nodule that she would like to discuss with the nurse.

## 2018-01-28 NOTE — ED Notes (Signed)
Patient transported to CT via stretcher.

## 2018-01-28 NOTE — ED Notes (Signed)
Patient states she has a hx of diverticulosis, had lots of popcorn on Saturday. States her abdomen is bloated and painful with some sharp, stabbing pains.

## 2018-01-28 NOTE — ED Notes (Signed)
Lab called and they will add on a urine culture. 

## 2018-01-28 NOTE — Telephone Encounter (Signed)
Nodule found on CT last was on her lung.  She is advised to follow up with her primary care MD.  She was diagnosed last night with diverticulitis.  She was started on antibiotics .  She will call back for additional questions or concerns.

## 2018-01-28 NOTE — ED Provider Notes (Signed)
[facility] DEPT Provider Note: [name], MD, FACEP  CSN: [id number] MRN: [id number] ARRIVAL: 01/27/18 at 2131 ROOM: [facility]  Abdominal Pain   HISTORY OF PRESENT ILLNESS  01/28/18 12:29 AM [name] Horn is a 58 y.o. female with a 4-day history of lower abdominal pain.  The pain has come on gradually.  The pain has both sharp and cramping component.  It is worse with movement or palpation.  She rates it as a 5 out of 10 presently but it has been more severe at times.  She denies associated nausea or vomiting but states her stool output has been lower than usual.  She noted a low-grade temperature of 100.1 yesterday evening so came to the ED where her temperature was 100.4.    Past Medical History:  Diagnosis Date  . Allergic rhinitis   . Anesthesia complication    Per pt, "sensitive" to sedation!  . Anxiety   . Arthritis    left thumb  . Asthma    reactive (cats, cigarette smoke)  . Claustrophobia   . Diverticulosis   . GERD (gastroesophageal reflux disease)   . Hemorrhoids   . IBS (irritable bowel syndrome)    got better from changing diet  . Iron deficiency anemia   . Obstructive sleep apnea 2/07   Mild, positional  . Post-operative nausea and vomiting    after ankle surgery 2006  . PVC's (premature ventricular contractions) 06/2011   on Holter  . Sleep apnea    no c-pap  . Tubular adenoma of colon 05/2010    Past Surgical History:  Procedure Laterality Date  . ANKLE SURGERY Right 2006   placement of screws and pins with subsequent removal (2010)  . CESAREAN SECTION     x 2  . EXCISION ORAL TUMOR N/A 03/24/2017   Procedure: MINOR SALIVARY GLAND BIOSPY;  Surgeon: Carloyn Manner, MD;  Location: [facility];  Service: ENT;  Laterality: N/A;  LOCAL WITH IV SEDATION sleep apnea  . NASAL SEPTUM SURGERY    . TONSILLECTOMY      Family History  Problem Relation Age of Onset  . Colon polyps Mother        adenomatous polyps  in her 57's  . Alzheimer's disease Mother   . Diabetes Mother   . Heart disease Mother   . Pneumonia Father   . Colon cancer Maternal Grandmother        in her 27's  . Colitis Brother   . Diverticulitis Brother   . Stomach cancer Brother        half brother  . Esophageal cancer Neg Hx   . Rectal cancer Neg Hx     Social History   Tobacco Use  . Smoking status: Never Smoker  . Smokeless tobacco: Never Used  Substance Use Topics  . Alcohol use: Yes    Alcohol/week: 2.0 standard drinks    Types: 2 Cans of beer per week  . Drug use: No    Prior to Admission medications   Medication Sig Start Date End Date Taking? Authorizing Provider  ibuprofen (ADVIL,MOTRIN) 200 MG tablet Take 200 mg by mouth every 6 (six) hours as needed for moderate pain.    Yes [provider]  Multiple Vitamin (MULTIVITAMIN PO) Take 1 tablet by mouth daily.    Yes [provider]  ondansetron (ZOFRAN) 4 MG tablet Take 1 tablet (4 mg total) by mouth every 8 (eight) hours as needed for up to  10 doses for nausea or vomiting. Patient not taking: Reported on 01/28/2018 03/24/17   Carloyn Manner, MD    Allergies Aspirin; Ciprofloxacin; and Sulfa antibiotics   REVIEW OF SYSTEMS  Negative except as noted here or in the History of Present Illness.   PHYSICAL EXAMINATION  Initial Vital Signs Blood pressure (!) 165/80, pulse (!) 105, temperature (!) 100.4 F (38 C), temperature source Oral, resp. rate 18, weight 86.2 kg, last menstrual period 02/07/2011, SpO2 97 %.  Examination General: Well-developed, well-nourished female in no acute distress; appearance consistent with age of record HENT: normocephalic; atraumatic Eyes: pupils equal, round and reactive to light; extraocular muscles intact Neck: supple Heart: regular rate and rhythm Lungs: clear to auscultation bilaterally Abdomen: soft; nondistended; left lower quadrant and suprapubic tenderness; pressure on right lower quadrant  reproduces pain in left lower quadrant; bowel sounds present Extremities: No deformity; full range of motion; pulses normal Neurologic: Awake, alert and oriented; motor function intact in all extremities and symmetric; no facial droop Skin: Warm and dry Psychiatric: Normal mood and affect   RESULTS  Summary of this visit's results, reviewed by myself:   EKG Interpretation  Date/Time:    Ventricular Rate:    PR Interval:    QRS Duration:   QT Interval:    QTC Calculation:   R Axis:     Text Interpretation:        Laboratory Studies: Results for orders placed or performed during the hospital encounter of 01/28/18 (from the past 24 hour(s))  Urinalysis, Routine w reflex microscopic     Status: Abnormal   Collection Time: 01/27/18 11:05 PM  Result Value Ref Range   Color, Urine YELLOW YELLOW   APPearance CLEAR CLEAR   Specific Gravity, Urine 1.014 1.005 - 1.030   pH 9.0 (H) 5.0 - 8.0   Glucose, UA NEGATIVE NEGATIVE mg/dL   Hgb urine dipstick NEGATIVE NEGATIVE   Bilirubin Urine NEGATIVE NEGATIVE   Ketones, ur NEGATIVE NEGATIVE mg/dL   Protein, ur NEGATIVE NEGATIVE mg/dL   Nitrite NEGATIVE NEGATIVE   Leukocytes, UA MODERATE (A) NEGATIVE   RBC / HPF 0-5 0 - 5 RBC/hpf   WBC, UA 21-50 0 - 5 WBC/hpf   Bacteria, UA RARE (A) NONE SEEN   Squamous Epithelial / LPF 0-5 0 - 5  Lipase, blood     Status: Abnormal   Collection Time: 01/28/18 12:33 AM  Result Value Ref Range   Lipase 57 (H) 11 - 51 U/L  Comprehensive metabolic panel     Status: Abnormal   Collection Time: 01/28/18 12:33 AM  Result Value Ref Range   Sodium 139 135 - 145 mmol/L   Potassium 4.4 3.5 - 5.1 mmol/L   Chloride 103 98 - 111 mmol/L   CO2 26 22 - 32 mmol/L   Glucose, Bld 122 (H) 70 - 99 mg/dL   BUN 10 6 - 20 mg/dL   Creatinine, Ser 0.72 0.44 - 1.00 mg/dL   Calcium 9.0 8.9 - 10.3 mg/dL   Total Protein 7.4 6.5 - 8.1 g/dL   Albumin 4.1 3.5 - 5.0 g/dL   AST 19 15 - 41 U/L   ALT 15 0 - 44 U/L   Alkaline  Phosphatase 77 38 - 126 U/L   Total Bilirubin 0.9 0.3 - 1.2 mg/dL   GFR calc non Af Amer >60 >60 mL/min   GFR calc Af Amer >60 >60 mL/min   Anion gap 10 5 - 15  CBC with Differential/Platelet  Status: Abnormal   Collection Time: 01/28/18 12:33 AM  Result Value Ref Range   WBC 12.8 (H) 4.0 - 10.5 K/uL   RBC 4.44 3.87 - 5.11 MIL/uL   Hemoglobin 12.2 12.0 - 15.0 g/dL   HCT 39.3 36.0 - 46.0 %   MCV 88.5 80.0 - 100.0 fL   MCH 27.5 26.0 - 34.0 pg   MCHC 31.0 30.0 - 36.0 g/dL   RDW 13.1 11.5 - 15.5 %   Platelets 319 150 - 400 K/uL   nRBC 0.0 0.0 - 0.2 %   Neutrophils Relative % 73 %   Neutro Abs 9.3 (H) 1.7 - 7.7 K/uL   Lymphocytes Relative 18 %   Lymphs Abs 2.3 0.7 - 4.0 K/uL   Monocytes Relative 6 %   Monocytes Absolute 0.8 0.1 - 1.0 K/uL   Eosinophils Relative 2 %   Eosinophils Absolute 0.2 0.0 - 0.5 K/uL   Basophils Relative 0 %   Basophils Absolute 0.0 0.0 - 0.1 K/uL   Immature Granulocytes 1 %   Abs Immature Granulocytes 0.07 0.00 - 0.07 K/uL   Imaging Studies: Ct Abdomen Pelvis W Contrast  Result Date: 01/28/2018 CLINICAL DATA:  58 year old female with abdominal pain. Concern for acute diverticulitis. EXAM: CT ABDOMEN AND PELVIS WITH CONTRAST TECHNIQUE: Multidetector CT imaging of the abdomen and pelvis was performed using the standard protocol following bolus administration of intravenous contrast. CONTRAST:  1110mL ISOVUE-300 IOPAMIDOL (ISOVUE-300) INJECTION 61% COMPARISON:  None. FINDINGS: Lower chest: There is a 4 mm nodule at the left lung base (series 2, image 9). The visualized lung bases are otherwise clear. No intra-abdominal free air or free fluid. Hepatobiliary: No focal liver abnormality is seen. No gallstones, gallbladder wall thickening, or biliary dilatation. Pancreas: Unremarkable. No pancreatic ductal dilatation or surrounding inflammatory changes. Spleen: Normal in size without focal abnormality. Adrenals/Urinary Tract: Adrenal glands are unremarkable. Kidneys  are normal, without renal calculi, focal lesion, or hydronephrosis. Bladder is unremarkable. Stomach/Bowel: There is sigmoid diverticulosis without active inflammatory changes compatible with acute diverticulitis. No diverticular abscess or perforation. Normal caliber fecalized loops of small bowel may represent increased transit time or small intestine bacterial overgrowth. There is no bowel obstruction. The appendix is normal. Vascular/Lymphatic: The abdominal aorta and IVC appear unremarkable. No portal venous gas. There is no adenopathy. Reproductive: The uterus is anteverted and grossly unremarkable. No pelvic mass. Other: Small fat containing umbilical hernia. Musculoskeletal: Bilateral L5 pars defects. No listhesis. No acute osseous pathology. IMPRESSION: 1. Sigmoid diverticulitis. No diverticular abscess or perforation. 2. No bowel obstruction. Normal appendix. 3. A 4 mm nodule at the left lung base. Electronically Signed   By: Anner Crete M.D.   On: 01/28/2018 02:29    ED COURSE and MDM  Nursing notes and initial vitals signs, including pulse oximetry, reviewed.  Vitals:   01/28/18 0045 01/28/18 0100 01/28/18 0130 01/28/18 0230  BP:  (!) 133/53 (!) 130/49 (!) 120/52  Pulse: 90 88 90 91  Resp:  16 15 15   Temp:      TempSrc:      SpO2: 96% 95% 95% 97%  Weight:       Rocephin and Flagyl given IV in the ED for diverticulitis.  Will send home on Augmentin as patient is allergic to Cipro.  She was advised of the finding of a lung nodule on her CT.  She will follow-up with her primary care physician.  PROCEDURES    ED DIAGNOSES     ICD-10-CM   1.  Sigmoid diverticulitis K57.32        Anita Laguna, Jenny Reichmann, MD 01/28/18 [phone]

## 2018-01-29 LAB — URINE CULTURE

## 2018-02-02 ENCOUNTER — Telehealth: Payer: Self-pay | Admitting: Gastroenterology

## 2018-02-02 NOTE — Telephone Encounter (Signed)
Patient reports that she is still having some urinary symptoms.  She is encouraged to see her primary care for her continued pain with urination.  She is asked to complete all of the antibiotics prescribed at the ED for diverticulitis and make an appt here if her symptoms fail to improve.

## 2018-02-02 NOTE — Telephone Encounter (Signed)
Pt called in and stated that she had diverticulosis and UTI and wants to know if she needs  To be tested again.

## 2018-02-07 DIAGNOSIS — K5792 Diverticulitis of intestine, part unspecified, without perforation or abscess without bleeding: Secondary | ICD-10-CM | POA: Diagnosis not present

## 2018-02-07 DIAGNOSIS — R030 Elevated blood-pressure reading, without diagnosis of hypertension: Secondary | ICD-10-CM | POA: Diagnosis not present

## 2018-02-07 DIAGNOSIS — R300 Dysuria: Secondary | ICD-10-CM | POA: Diagnosis not present

## 2018-02-07 DIAGNOSIS — R918 Other nonspecific abnormal finding of lung field: Secondary | ICD-10-CM | POA: Diagnosis not present

## 2018-02-09 ENCOUNTER — Other Ambulatory Visit (INDEPENDENT_AMBULATORY_CARE_PROVIDER_SITE_OTHER): Payer: Federal, State, Local not specified - PPO

## 2018-02-09 ENCOUNTER — Encounter: Payer: Self-pay | Admitting: Physician Assistant

## 2018-02-09 ENCOUNTER — Ambulatory Visit: Payer: Federal, State, Local not specified - PPO | Admitting: Physician Assistant

## 2018-02-09 VITALS — BP 126/86 | HR 82 | Ht 63.0 in | Wt 188.5 lb

## 2018-02-09 DIAGNOSIS — R300 Dysuria: Secondary | ICD-10-CM

## 2018-02-09 DIAGNOSIS — K5732 Diverticulitis of large intestine without perforation or abscess without bleeding: Secondary | ICD-10-CM | POA: Diagnosis not present

## 2018-02-09 DIAGNOSIS — R1032 Left lower quadrant pain: Secondary | ICD-10-CM | POA: Diagnosis not present

## 2018-02-09 DIAGNOSIS — R1031 Right lower quadrant pain: Secondary | ICD-10-CM

## 2018-02-09 DIAGNOSIS — R9389 Abnormal findings on diagnostic imaging of other specified body structures: Secondary | ICD-10-CM | POA: Diagnosis not present

## 2018-02-09 DIAGNOSIS — G8929 Other chronic pain: Secondary | ICD-10-CM

## 2018-02-09 LAB — URINALYSIS, ROUTINE W REFLEX MICROSCOPIC
Bilirubin Urine: NEGATIVE
Hgb urine dipstick: NEGATIVE
Ketones, ur: NEGATIVE
NITRITE: NEGATIVE
RBC / HPF: NONE SEEN (ref 0–?)
Specific Gravity, Urine: 1.01 (ref 1.000–1.030)
Total Protein, Urine: NEGATIVE
Urine Glucose: NEGATIVE
Urobilinogen, UA: 0.2 (ref 0.0–1.0)
pH: 6 (ref 5.0–8.0)

## 2018-02-09 LAB — CBC WITH DIFFERENTIAL/PLATELET
Basophils Absolute: 0.1 10*3/uL (ref 0.0–0.1)
Basophils Relative: 0.9 % (ref 0.0–3.0)
Eosinophils Absolute: 0.1 10*3/uL (ref 0.0–0.7)
Eosinophils Relative: 1.4 % (ref 0.0–5.0)
HCT: 40.5 % (ref 36.0–46.0)
HEMOGLOBIN: 13.2 g/dL (ref 12.0–15.0)
Lymphocytes Relative: 29.4 % (ref 12.0–46.0)
Lymphs Abs: 2.8 10*3/uL (ref 0.7–4.0)
MCHC: 32.5 g/dL (ref 30.0–36.0)
MCV: 83.3 fl (ref 78.0–100.0)
Monocytes Absolute: 0.5 10*3/uL (ref 0.1–1.0)
Monocytes Relative: 5.2 % (ref 3.0–12.0)
Neutro Abs: 5.9 10*3/uL (ref 1.4–7.7)
Neutrophils Relative %: 63.1 % (ref 43.0–77.0)
Platelets: 376 10*3/uL (ref 150.0–400.0)
RBC: 4.86 Mil/uL (ref 3.87–5.11)
RDW: 13.9 % (ref 11.5–15.5)
WBC: 9.4 10*3/uL (ref 4.0–10.5)

## 2018-02-09 MED ORDER — AMOXICILLIN-POT CLAVULANATE 875-125 MG PO TABS: ORAL_TABLET | ORAL | 0 refills | Status: AC

## 2018-02-09 MED ORDER — DICYCLOMINE HCL 10 MG PO CAPS: ORAL_CAPSULE | ORAL | 0 refills | Status: DC

## 2018-02-09 NOTE — Telephone Encounter (Signed)
Patient diagnosed with diverticulitis approximately 2 weeks ago.  Her pain has returned.  RLQ pain and rectal pressure and discomfort.  She will come in and see Nicoletta Ba PA today at 2:00

## 2018-02-09 NOTE — Progress Notes (Signed)
Subjective:    Patient ID: [name] Horn, female    DOB: Mar 29, 1960, 58 y.o.   MRN: [id number]  HPI [name] Horn is a pleasant 58 year old white female, known to Dr. Fuller Plan, who comes in with recurrent lower abdominal pain. Patient has history of IBS, diverticulosis, GERD and sleep apnea. She had recent ER visit on 01/28/2018 with acute lower abdominal pain.  She underwent CT of the abdomen and pelvis which revealed a 4 mm left lung base lung nodule and sigmoid diverticulitis without evidence for perforation or abscess. She was noted to have leukocytosis and also had an abnormal UA with multiple WBCs.  Culture showed multiple species.  She was given IV antibiotics in the emergency room and then discharged home with a 7-day course of Augmentin she has completed. She says she immediately felt better after the IV antibiotics and continued to improve on the Augmentin.  When she finished the course of Augmentin symptoms had resolved.  This past weekend she ate some popcorn and thinks this may have stirred things back up.  Yesterday she began noticing some right lower abdominal pain/discomfort and started having rectal pressure which she had had previously with the diverticulitis.  She has not noticed any fever or chills, no nausea or vomiting.  She has not eaten much because she is afraid it will cause pain.  She is not having any difficulty urinating but with emptying her bladder has noted some twinges of suprapubic discomfort.  Bowel movements have been fairly regular no melena or hematochezia.  She  have a trip planned this weekend to [place].  Last colonoscopy was done in February 2018 for history of adenomatous polyps.  She was found to have 1 7 mm polyp a tubular adenoma and multiple left colon diverticuli.  Review of Systems Pertinent positive and negative review of systems were noted in the above HPI section.  All other review of systems was otherwise negative.  Outpatient Encounter Medications  as of 02/09/2018  Medication Sig  . Cholecalciferol (D3-1000 PO) Take by mouth daily.  Marland Kitchen ibuprofen (ADVIL,MOTRIN) 200 MG tablet Take 200 mg by mouth every 6 (six) hours as needed for moderate pain.   . Multiple Vitamin (MULTIVITAMIN PO) Take 1 tablet by mouth daily.   . vitamin B-12 (CYANOCOBALAMIN) 500 MCG tablet Take 500 mcg by mouth daily.  Marland Kitchen amoxicillin-clavulanate (AUGMENTIN) 875-125 MG tablet Take 1 tablet by mouth twice daily for 10 days.  Marland Kitchen dicyclomine (BENTYL) 10 MG capsule Take 1 tablet by mouth 3 times daily as needed for pain and cramping.  . [DISCONTINUED] amoxicillin-clavulanate (AUGMENTIN) 875-125 MG tablet Take 1 tablet by mouth 2 (two) times daily. One po bid x 7 days  . [DISCONTINUED] HYDROcodone-acetaminophen (NORCO) 5-325 MG tablet Take 1 tablet by mouth every 6 (six) hours as needed (for pain; may cause constipation).   No facility-administered encounter medications on file as of 02/09/2018.    Allergies  Allergen Reactions  . Aspirin     Wheezing,SOB  . Ciprofloxacin Rash  . Sulfa Antibiotics Rash   Patient Active Problem List   Diagnosis Date Noted  . Palpitations 02/12/2015  . Cough 02/18/2012  . Chest pain 02/18/2012  . IRON DEFICIENCY 02/28/2008  . FLATULENCE ERUCTATION AND GAS PAIN 02/09/2008  . HEMORRHOIDS-EXTERNAL 02/08/2008  . DIARRHEA 02/08/2008  . ABDOMINAL PAIN-MULTIPLE SITES 02/08/2008  . GERD 08/29/2007  . IRRITABLE BOWEL SYNDROME 08/29/2007  . FUNCTIONAL DIARRHEA 08/29/2007  . CHANGE IN BOWELS 08/29/2007   Social History   Socioeconomic History  .  Marital status: Married    Spouse name: Not on file  . Number of children: Not on file  . Years of education: Not on file  . Highest education level: Not on file  Occupational History  . Occupation: Actor  . Financial resource strain: Not on file  . Food insecurity:    Worry: Not on file    Inability: Not on file  . Transportation needs:    Medical: Not on file      Non-medical: Not on file  Tobacco Use  . Smoking status: Never Smoker  . Smokeless tobacco: Never Used  Substance and Sexual Activity  . Alcohol use: Yes    Alcohol/week: 2.0 standard drinks    Types: 2 Cans of beer per week  . Drug use: No  . Sexual activity: Not on file  Lifestyle  . Physical activity:    Days per week: Not on file    Minutes per session: Not on file  . Stress: Not on file  Relationships  . Social connections:    Talks on phone: Not on file    Gets together: Not on file    Attends religious service: Not on file    Active member of club or organization: Not on file    Attends meetings of clubs or organizations: Not on file    Relationship status: Not on file  . Intimate partner violence:    Fear of current or ex partner: Not on file    Emotionally abused: Not on file    Physically abused: Not on file    Forced sexual activity: Not on file  Other Topics Concern  . Not on file  Social History Narrative  . Not on file    [name] Horn's family history includes Alzheimer's disease in her mother; Colitis in her brother; Colon cancer in her maternal grandmother; Colon polyps in her mother; Diabetes in her mother; Diverticulitis in her brother; Heart disease in her mother; Pneumonia in her father; Stomach cancer in her brother.      Objective:    Vitals:   02/09/18 1405  BP: 126/86  Pulse: 82    Physical Exam; well-developed older white female in no acute distress, pleasant, BMI 33.3.  HEENT; nontraumatic normocephalic EOMI PERRLA sclera anicteric, oral mucosa moist, Cardiovascular; regular rate and rhythm with S1-S2 no murmur rub or gallop, Pulmonary ;clear bilaterally, Abdomen ;soft, bowel sounds are present, she is tender in the left lower quadrant, suprapubic area and mildly in the right lower quadrant there is no guarding or rebound no palpable mass or hepatosplenomegaly. Rectal ;exam not done, Extremities; no clubbing cyanosis or edema skin warm dry,  Neuropsych ;alert and oriented, grossly nonfocal mood and affect appropriate       Assessment & Plan:   #90 58 year old white female with acute diverticulitis/sigmoid diagnosed 01/28/2018 by CT.  Patient completed a 7-day course of Augmentin with almost complete resolution of symptoms.  She had recurrence of lower abdominal discomfort yesterday vague dysuria and now with recurrent rectal pressure which she had had previously as well. Patient appeared to have a UTI at the same time as the diverticulitis though culture they have been contaminated with finding of multiple species.  CT did not show any evidence of bladder inflammation.  I suspect she has had partially treated diverticulitis with recurrence and symptoms versus recurrent diverticulitis.  Rule out concurrent UTI, will out bladder inflammation associated with diverticulitis  #2 history of adenomatous colon polyps-up-to-date with  last colonoscopy February 2018 #3 IBS #4 GERD #5 left lung base nodule-undergoing evaluation per pulmonary  Plan; CBC with differential, UA and culture Restart Augmentin 875 mg p.o. twice daily x10 days.  I do not think she has to have repeat imaging at this time but she was advised that if her symptoms worsen over the next days that she will need repeat imaging.  She is also advised to call when she is nearing completion of the 10-day course of Augmentin, and if symptoms have not completely resolved we will consider imaging versus longer course of Augmentin.  We will give her a trial of dicyclomine 10 mg 3 times daily as needed for abdominal pain/discomfort MiraLAX 17 g in 8 ounces of water daily, as needed for constipation.   [name] Mimbs S Unice Vantassel PA-C 02/09/2018   Cc: Crist Infante, MD

## 2018-02-09 NOTE — Patient Instructions (Addendum)
Your provider has requested that you go to the basement level for lab work before leaving today. Press "B" on the elevator. The lab is located at the first door on the left as you exit the elevator. We sent prescriptions to [facility];  [facility].  1. Augmentin 875 mg -125 mg 2. Bentyl ( dicyclomine) 10 mg  Take Tylenol as needed. We have provided you with a Low residue diet handout. Eat a soft diet until pain subsides.  Call next week with an update. Ask for Amy's nurse Beth. [phone] option 2.   Normal BMI (Body Mass Index- based on height and weight) is between 19 and 25. Your BMI today is Body mass index is 33.39 kg/m. Marland Kitchen Please consider follow up  regarding your BMI with your Primary Care Provider.

## 2018-02-09 NOTE — Telephone Encounter (Signed)
Pt stated that she had diverticulitis attack 2 weeks prior and is now experiencing similar pain.  Please advise.

## 2018-02-10 ENCOUNTER — Telehealth: Payer: Self-pay | Admitting: Gastroenterology

## 2018-02-10 ENCOUNTER — Other Ambulatory Visit: Payer: Federal, State, Local not specified - PPO

## 2018-02-10 DIAGNOSIS — R300 Dysuria: Secondary | ICD-10-CM

## 2018-02-10 DIAGNOSIS — R911 Solitary pulmonary nodule: Secondary | ICD-10-CM | POA: Diagnosis not present

## 2018-02-10 NOTE — Progress Notes (Signed)
Reviewed and agree with management plan.  Annsley Akkerman T. Wendi Lastra, MD FACG 

## 2018-02-11 ENCOUNTER — Telehealth: Payer: Self-pay | Admitting: Physician Assistant

## 2018-02-11 NOTE — Telephone Encounter (Signed)
Pt had OV with Amy 02/09/18.  Pt reported that she is having an issue with diverticulitis and would like to know when she can incorporate solid food into her diet.  Please CB.

## 2018-02-11 NOTE — Telephone Encounter (Signed)
Left a message to call 

## 2018-02-12 LAB — URINE CULTURE
MICRO NUMBER:: 128118
SPECIMEN QUALITY:: ADEQUATE

## 2018-02-14 NOTE — Telephone Encounter (Signed)
Continues to feel better. She has been eating "just white" foods.  Daily Miralax. She wants to know if she can begin adding vegetables and fruits back into her diet.

## 2018-02-15 ENCOUNTER — Telehealth: Payer: Self-pay

## 2018-02-15 NOTE — Telephone Encounter (Signed)
Notes recorded by Greggory Keen, LPN on 09/15/7652 at 6:50 PM EST Allergy to Cipro and Sulfa drugs. Will call Dr Perini's office for input Patient is aware of this.  Call to [facility]. Dr Joylene Draft is out of the office. I have left a message on the voicemail

## 2018-02-15 NOTE — Telephone Encounter (Signed)
-----   Message from Alfredia Ferguson, PA-C sent at 02/14/2018  1:36 PM EST ----- Eustaquio Maize - glad pt is feeling better - ok to advance diet to her regular diet  At this point. urine culture  Grew Pseudomonas  Which is not covered by the Augmentin she is taking . It is sensitive to Cipro - would like to add Cipro 500 mg po BID x 5 days. thanks

## 2018-02-15 NOTE — Telephone Encounter (Signed)
Left message on the voicemail of [name] Horn at [facility] asking for the status of the issue.

## 2018-02-15 NOTE — Telephone Encounter (Signed)
[facility] contacted the patient. They are repeating the urine culture and will "take it from here."

## 2018-02-15 NOTE — Telephone Encounter (Signed)
Spoke with Caryl Pina, RN She will take this information to the doctor covering for Dr Joylene Draft. Culture results faxed to her at [phone]

## 2018-02-17 DIAGNOSIS — N390 Urinary tract infection, site not specified: Secondary | ICD-10-CM | POA: Diagnosis not present

## 2018-02-17 DIAGNOSIS — R82998 Other abnormal findings in urine: Secondary | ICD-10-CM | POA: Diagnosis not present

## 2018-03-29 DIAGNOSIS — N390 Urinary tract infection, site not specified: Secondary | ICD-10-CM | POA: Diagnosis not present

## 2018-03-29 DIAGNOSIS — R300 Dysuria: Secondary | ICD-10-CM | POA: Diagnosis not present

## 2018-04-08 DIAGNOSIS — N950 Postmenopausal bleeding: Secondary | ICD-10-CM | POA: Diagnosis not present

## 2018-04-08 DIAGNOSIS — N952 Postmenopausal atrophic vaginitis: Secondary | ICD-10-CM | POA: Diagnosis not present

## 2018-04-08 DIAGNOSIS — R300 Dysuria: Secondary | ICD-10-CM | POA: Diagnosis not present

## 2018-06-20 NOTE — Telephone Encounter (Signed)
done

## 2018-08-15 DIAGNOSIS — M25561 Pain in right knee: Secondary | ICD-10-CM | POA: Diagnosis not present

## 2018-08-19 ENCOUNTER — Other Ambulatory Visit: Payer: Self-pay | Admitting: Internal Medicine

## 2018-08-19 DIAGNOSIS — R911 Solitary pulmonary nodule: Secondary | ICD-10-CM

## 2018-09-01 ENCOUNTER — Other Ambulatory Visit: Payer: Federal, State, Local not specified - PPO

## 2018-09-06 ENCOUNTER — Other Ambulatory Visit: Payer: Self-pay | Admitting: Gastroenterology

## 2018-09-06 ENCOUNTER — Telehealth: Payer: Self-pay | Admitting: Gastroenterology

## 2018-09-06 DIAGNOSIS — R1032 Left lower quadrant pain: Secondary | ICD-10-CM

## 2018-09-06 MED ORDER — AMOXICILLIN-POT CLAVULANATE 875-125 MG PO TABS: 1.0000 | ORAL_TABLET | Freq: Two times a day (BID) | ORAL | 0 refills | Status: AC

## 2018-09-06 MED ORDER — DICYCLOMINE HCL 10 MG PO CAPS: ORAL_CAPSULE | ORAL | 0 refills | Status: DC

## 2018-09-06 NOTE — Telephone Encounter (Signed)
Patient having progressive pain and has called back this evening. Describes having recently had a subjective fever with continued diarrhea.  Pain and cramping worsening and she is wondering whether she needs to go to the ED or not but she would like to minimize her exposure as much as possible due to the COVID-19 pandemic.  I told the patient that as she was treated earlier this year for the possibility of underlying diverticulitis that there remains a high degree of possibility of this.  I think it is reasonable to initiate antibiotics and see how she does overnight into tomorrow.  If she has progressive worsening of pain or discomfort overnight or into tomorrow morning then she understands the need of having to be further evaluated in the ED or if necessary obtain a CT abdomen/pelvis with IV/oral contrast.  She is scheduled for a CT chest early tomorrow morning for follow-up of other imaging studies but this is at 9 in the morning.  It would not be possible to add on a CT abdomen/pelvis. I will send in a prescription for Augmentin for total of 10 days. Sheri please reach out to the patient tomorrow and see how she is doing and hopefully she is doing better if not then would consider if she is not having severe pain but persistent discomfort then see if we can get a CT abdomen/pelvis with IV/oral contrast performed later in the afternoon or the next day.  Otherwise if she is still having significant issues I am sure that Dr. Fuller Plan would want her to be evaluated in the emergency department. Patient also asked whether she would be a surgical candidate will need a new colonoscopy to check on things and I told her that in the setting of her known imaging findings as well as her most recent colonoscopy 2018 that it would be unlikely she would need a repeat colonoscopy at this time.  A surgical referral can be considered but usually after patient has at least 2-3 documented imaging and clinical episodes of  diverticulitis before a segmental resection is performed. Patient appreciative for the call back this evening. She will update [place] if things dramatically change overnight.  Justice Britain, MD [facility] # [phone]

## 2018-09-06 NOTE — Telephone Encounter (Signed)
Agree with plans as outlined.

## 2018-09-06 NOTE — Telephone Encounter (Signed)
Pt reported that she is having a diverticulitis attack.  Please advise.

## 2018-09-06 NOTE — Telephone Encounter (Signed)
Patent reports that last week she had a day of diarrhea more than 10 episodes.  She has had some soft stools between now and then.  She reports that today she feels "a little tender in my intestines" and had had soft stools.  She denies fever, constipation, rectal bleeding, and  is tolerating a normal diet. This is not the same pain she had when she had diverticulitis in January.  She feels she had a bug last week and "just not over it".  She is out of the dicyclomine that was prescribed in January.  She will try a bland diet for the next 24 hours, try the dicyclomine ( refill sent) for the abdominal tenderness.  She will call me back by Thursday if she does not feel better or earlier if her symptoms worsen.

## 2018-09-06 NOTE — Addendum Note (Signed)
Addended by: Justice Britain on: 09/06/2018 07:19 PM   Modules accepted: Orders

## 2018-09-07 ENCOUNTER — Ambulatory Visit
Admission: RE | Admit: 2018-09-07 | Discharge: 2018-09-07 | Disposition: A | Discharge status: Home / Self Care | Payer: Federal, State, Local not specified - PPO | Source: Ambulatory Visit | Attending: Internal Medicine | Admitting: Internal Medicine

## 2018-09-07 ENCOUNTER — Other Ambulatory Visit (INDEPENDENT_AMBULATORY_CARE_PROVIDER_SITE_OTHER): Payer: Federal, State, Local not specified - PPO

## 2018-09-07 ENCOUNTER — Other Ambulatory Visit: Payer: Self-pay

## 2018-09-07 ENCOUNTER — Telehealth: Payer: Self-pay | Admitting: Gastroenterology

## 2018-09-07 DIAGNOSIS — R918 Other nonspecific abnormal finding of lung field: Secondary | ICD-10-CM | POA: Diagnosis not present

## 2018-09-07 DIAGNOSIS — R1032 Left lower quadrant pain: Secondary | ICD-10-CM | POA: Diagnosis not present

## 2018-09-07 DIAGNOSIS — R911 Solitary pulmonary nodule: Secondary | ICD-10-CM

## 2018-09-07 LAB — COMPREHENSIVE METABOLIC PANEL
ALT: 16 U/L (ref 0–35)
AST: 16 U/L (ref 0–37)
Albumin: 4.3 g/dL (ref 3.5–5.2)
Alkaline Phosphatase: 97 U/L (ref 39–117)
BUN: 13 mg/dL (ref 6–23)
CO2: 31 mEq/L (ref 19–32)
Calcium: 9.6 mg/dL (ref 8.4–10.5)
Chloride: 98 mEq/L (ref 96–112)
Creatinine, Ser: 0.77 mg/dL (ref 0.40–1.20)
GFR: 77.25 mL/min (ref >60.00)
Glucose, Bld: 91 mg/dL (ref 70–99)
Potassium: 4.1 mEq/L (ref 3.5–5.1)
Sodium: 137 mEq/L (ref 135–145)
Total Bilirubin: 0.5 mg/dL (ref 0.2–1.2)
Total Protein: 7.6 g/dL (ref 6.0–8.3)

## 2018-09-07 LAB — CBC
HCT: 40.4 % (ref 36.0–46.0)
Hemoglobin: 13.3 g/dL (ref 12.0–15.0)
MCHC: 33 g/dL (ref 30.0–36.0)
MCV: 83.6 fl (ref 78.0–100.0)
Platelets: 404 10*3/uL — ABNORMAL HIGH (ref 150.0–400.0)
RBC: 4.83 Mil/uL (ref 3.87–5.11)
RDW: 13.8 % (ref 11.5–15.5)
WBC: 10.2 10*3/uL (ref 4.0–10.5)

## 2018-09-07 LAB — LIPASE: Lipase: 40 U/L (ref 11.0–59.0)

## 2018-09-07 NOTE — Addendum Note (Signed)
Addended by: Marlon Pel on: 09/07/2018 11:53 AM   Modules accepted: Orders

## 2018-09-07 NOTE — Telephone Encounter (Signed)
Sheri if she is still having pain this morning please ask her to continue Augmentin and add on with an APP for evaluation. If APP visit today or tomorrow is not feasible then CBC, CMP, lipase and schedule CT AP with contrast.

## 2018-09-07 NOTE — Telephone Encounter (Signed)
See other phone notes.

## 2018-09-07 NOTE — Telephone Encounter (Signed)
I spoke with the patient.  She is feeling better, but is still having some pain.  She has agreed to come for the labs.  She would like to give the antibiotics another day and wait on CT.  She will call me back tomorrow if she is still having pain and she will agree to CT scan.   She has had two doses of augmentin.

## 2018-09-07 NOTE — Telephone Encounter (Signed)
Update noted.

## 2018-09-08 ENCOUNTER — Telehealth: Payer: Self-pay | Admitting: Gastroenterology

## 2018-09-08 NOTE — Telephone Encounter (Signed)
See lab results from yesterday for additional details.

## 2018-09-20 ENCOUNTER — Telehealth: Payer: Self-pay | Admitting: Cardiovascular Disease

## 2018-09-20 NOTE — Telephone Encounter (Signed)
Returned call to pt she states that she had a CT done and it says that she has some coronary blockages and she was wondering if we could see the CT results. Informed pt that we/Dr Gwenlyn Found can see this report and they can discuss this at her upcoming appt. All questions answered(generalized answers). Pt has generalized anxiety disorder-ok to have husband come to visit with her. ADDED TO APPT COMMENTS.

## 2018-09-20 NOTE — Telephone Encounter (Signed)
Returned call to pt she states that she would like to be seen sooner d/t her generalized anxiety disorder. Pt rescheduled ok per Chima

## 2018-09-20 NOTE — Telephone Encounter (Signed)
  Patient would like her husband to come with her to her appt due to her anxiety issues

## 2018-09-20 NOTE — Telephone Encounter (Signed)
Follow up   Per the previous message patient would like a call back.

## 2018-10-05 ENCOUNTER — Encounter: Payer: Self-pay | Admitting: Cardiovascular Disease

## 2018-10-05 ENCOUNTER — Ambulatory Visit: Payer: Federal, State, Local not specified - PPO | Admitting: Cardiovascular Disease

## 2018-10-05 ENCOUNTER — Other Ambulatory Visit: Payer: Self-pay

## 2018-10-05 VITALS — BP 133/75 | HR 83 | Ht 63.0 in | Wt 190.0 lb

## 2018-10-05 DIAGNOSIS — R002 Palpitations: Secondary | ICD-10-CM | POA: Diagnosis not present

## 2018-10-05 DIAGNOSIS — Z13220 Encounter for screening for lipoid disorders: Secondary | ICD-10-CM | POA: Diagnosis not present

## 2018-10-05 DIAGNOSIS — Z8249 Family history of ischemic heart disease and other diseases of the circulatory system: Secondary | ICD-10-CM | POA: Diagnosis not present

## 2018-10-05 DIAGNOSIS — R0789 Other chest pain: Secondary | ICD-10-CM | POA: Diagnosis not present

## 2018-10-05 DIAGNOSIS — R079 Chest pain, unspecified: Secondary | ICD-10-CM

## 2018-10-05 MED ORDER — METOPROLOL TARTRATE 100 MG PO TABS: 100.0000 mg | ORAL_TABLET | Freq: Once | ORAL | 0 refills | Status: DC

## 2018-10-05 NOTE — Assessment & Plan Note (Signed)
She recently does describe some atypical chest pain usually not occurring when she is exercising.  Recent chest CT did show three-vessel coronary calcification performed 09/07/2018.  I am going to get a coronary CTA to further evaluate

## 2018-10-05 NOTE — Patient Instructions (Addendum)
Your cardiac CT will be scheduled at one of the below locations:   [facility] [address] [place], [place] [address] (336) [place] [address] [place], [place] [address] [phone]  If scheduled at [facility], please arrive at the [facility] main entrance of [facility] 30-45 minutes prior to test start time. Proceed to the [facility] Radiology Department (first floor) to check-in and test prep.  If scheduled at [facility], please arrive 15 mins early for check-in and test prep.  Please follow these instructions carefully (unless otherwise directed):  Lab work: Your physician recommends that you return for lab work 3-7 Vivian ([place]; COMPLETE BLOOD COUNT)  If you have labs (blood work) drawn today and your tests are completely normal, you will receive your results only by: Marland Kitchen MyChart Message (if you have MyChart) OR . A paper copy in the mail If you have any lab test that is abnormal or we need to change your treatment, we will call you to review the results.  On the Night Before the Test: . Be sure to Drink plenty of water. . Do not consume any caffeinated/decaffeinated beverages or chocolate 12 hours prior to your test. . Do not take any antihistamines 12 hours prior to your test.  On the Day of the Test: . Drink plenty of water. Do not drink any water within one hour of the test. . Do not eat any food 4 hours prior to the test. . You may take your regular medications prior to the test.  . Take metoprolol (Lopressor) 100 mg two hours prior to test. . FEMALES- please wear underwire-free bra if available       After the Test: . Drink plenty of water. . After receiving IV contrast, you may experience a mild flushed feeling. This is normal. . On occasion, you may experience a mild rash up to 24 hours after  the test. This is not dangerous. If this occurs, you can take Benadryl 25 mg and increase your fluid intake. . If you experience trouble breathing, this can be serious. If it is severe call 911 IMMEDIATELY. If it is mild, please call our office.    Please contact the cardiac imaging nurse navigator should you have any questions/concerns Marchia Bond, RN Navigator [facility] [phone] Office  [phone] Cell   ____________________________________________________________  Lab work: Your physician recommends that you return for lab work WITHIN 1 WEEK: FASTING LIPID AND LIVER PANELS  If you have labs (blood work) drawn today and your tests are completely normal, you will receive your results only by: Marland Kitchen MyChart Message (if you have MyChart) OR . A paper copy in the mail If you have any lab test that is abnormal or we need to change your treatment, we will call you to review the results.  Testing/Procedures: Your physician has recommended that you wear a 14-DAY ZIO-PATCH monitor. The Zio patch cardiac monitor continuously records heart rhythm data for up to 14 days, this is for patients being evaluated for multiple types heart rhythms. For the first 24 hours post application, please avoid getting the Zio monitor wet in the shower or by excessive sweating during exercise. After that, feel free to carry on with regular activities. Keep soaps and lotions away from the ZIO XT Patch.  This will be placed at our [facility] location - [place], [address] OR MAILED TO YOUR ADDRESS.         Follow-Up: At [facility], you and your health needs are our priority.  As part of our continuing mission to provide you with exceptional heart care, we have created designated Provider Care Teams.  These Care Teams include your primary Cardiologist (physician) and Advanced Practice Providers (APPs -  Physician Assistants and Nurse Practitioners) who all  work together to provide you with the care you need, when you need it. You will need a follow up appointment in 6 months with Dr. Quay Burow unless your results are abnormal.  Please call our office 2 months in advance to schedule this/each appointment.

## 2018-10-05 NOTE — Assessment & Plan Note (Signed)
History of palpitations in the past with it which improved with quality of life modification, stress reduction and caffeine reduction back in May 2017.  She recently took a more stressful job and continues to drink caffeine.  She is had recurrent palpitations.  Cannot obtain at 2-week ZIO patch to further evaluate

## 2018-10-05 NOTE — Assessment & Plan Note (Signed)
Her half brother recently had 2 myocardial infarction's

## 2018-10-05 NOTE — Progress Notes (Signed)
10/05/2018 [name] Horn   [birth year]  [id number]  Primary Physician Crist Infante, MD Primary Cardiologist: Lorretta Harp MD Lupe Carney, [place]  HPI:  [name] Horn is a 58 y.o.  mildly overweight married Caucasian female mother of 2 children who works as an Associate Professor. She was referred by Dr. Joylene Draft for cardiology follow-up because of palpitations. I last saw her in the office 05/15/2015. She basically has no chronic risk factors. When I saw her over 3 years ago I thought her palpitations were related to stress, caffeine and potentially pre-menopause. [name] had a remote 2-D echo 2008 was essentially normal. An event monitor showed PVCs. She did cut back her caffeine intake from 2 to one cup a day. She does admit to stress with at work and at home. When I had seen her in the office in January her palpitations have gotten more pronounced over the last several months they've improved in frequency and severity. She says that she's having more fun, she is less stressed. She is drinking less caffeine and she is exercising.  Since I saw her 3 years ago she is taken a new job with more stress.  She continues to drink caffeine.  She has had some atypical chest pain.  Recent chest CT performed 09/07/2018 revealed three-vessel coronary calcification.  Her half brother recently had 2 myocardial infarctions.   Current Meds  Medication Sig  . Cholecalciferol (D3-1000 PO) Take by mouth daily.  Marland Kitchen dicyclomine (BENTYL) 10 MG capsule Take 1 tablet by mouth 3 times daily as needed for pain and cramping.  Marland Kitchen ibuprofen (ADVIL,MOTRIN) 200 MG tablet Take 200 mg by mouth every 6 (six) hours as needed for moderate pain.   . Multiple Vitamin (MULTIVITAMIN PO) Take 1 tablet by mouth daily.   . vitamin B-12 (CYANOCOBALAMIN) 500 MCG tablet Take 500 mcg by mouth daily.     Allergies  Allergen Reactions  . Aspirin     Wheezing,SOB  . Ciprofloxacin Rash  . Sulfa Antibiotics Rash    Social History    Socioeconomic History  . Marital status: Married    Spouse name: Not on file  . Number of children: Not on file  . Years of education: Not on file  . Highest education level: Not on file  Occupational History  . Occupation: Actor  . Financial resource strain: Not on file  . Food insecurity    Worry: Not on file    Inability: Not on file  . Transportation needs    Medical: Not on file    Non-medical: Not on file  Tobacco Use  . Smoking status: Never Smoker  . Smokeless tobacco: Never Used  Substance and Sexual Activity  . Alcohol use: Yes    Alcohol/week: 2.0 standard drinks    Types: 2 Cans of beer per week  . Drug use: No  . Sexual activity: Not on file  Lifestyle  . Physical activity    Days per week: Not on file    Minutes per session: Not on file  . Stress: Not on file  Relationships  . Social Herbalist on phone: Not on file    Gets together: Not on file    Attends religious service: Not on file    Active member of club or organization: Not on file    Attends meetings of clubs or organizations: Not on file    Relationship status: Not on file  .  Intimate partner violence    Fear of current or ex partner: Not on file    Emotionally abused: Not on file    Physically abused: Not on file    Forced sexual activity: Not on file  Other Topics Concern  . Not on file  Social History Narrative  . Not on file     Review of Systems: General: negative for chills, fever, night sweats or weight changes.  Cardiovascular: negative for chest pain, dyspnea on exertion, edema, orthopnea, palpitations, paroxysmal nocturnal dyspnea or shortness of breath Dermatological: negative for rash Respiratory: negative for cough or wheezing Urologic: negative for hematuria Abdominal: negative for nausea, vomiting, diarrhea, bright red blood per rectum, melena, or hematemesis Neurologic: negative for visual changes, syncope, or dizziness All other  systems reviewed and are otherwise negative except as noted above.    Blood pressure 133/75, pulse 83, height 5\' 3"  (1.6 m), weight 190 lb (86.2 kg), last menstrual period 02/07/2011, SpO2 99 %.  General appearance: alert and no distress Neck: no adenopathy, no carotid bruit, no JVD, supple, symmetrical, trachea midline and thyroid not enlarged, symmetric, no tenderness/mass/nodules Lungs: clear to auscultation bilaterally Heart: regular rate and rhythm, S1, S2 normal, no murmur, click, rub or gallop Extremities: extremities normal, atraumatic, no cyanosis or edema Pulses: 2+ and symmetric Skin: Skin color, texture, turgor normal. No rashes or lesions Neurologic: Alert and oriented X 3, normal strength and tone. Normal symmetric reflexes. Normal coordination and gait  EKG sinus rhythm at 83 without ST or T wave changes.  I personally reviewed this EKG.  Patient feels  ASSESSMENT AND PLAN:   Family history of heart disease Her half brother recently had 2 myocardial infarction's  Chest pain She recently does describe some atypical chest pain usually not occurring when she is exercising.  Recent chest CT did show three-vessel coronary calcification performed 09/07/2018.  I am going to get a coronary CTA to further evaluate  Palpitations History of palpitations in the past with it which improved with quality of life modification, stress reduction and caffeine reduction back in May 2017.  She recently took a more stressful job and continues to drink caffeine.  She is had recurrent palpitations.  Cannot obtain at 2-week ZIO patch to further evaluate      Lorretta Harp MD [facility], [facility] 10/05/2018 5:05 PM

## 2018-10-12 ENCOUNTER — Ambulatory Visit: Payer: Federal, State, Local not specified - PPO | Admitting: Cardiovascular Disease

## 2018-10-12 ENCOUNTER — Telehealth: Payer: Self-pay | Admitting: Cardiovascular Disease

## 2018-10-12 NOTE — Telephone Encounter (Signed)
New Message  Patient is calling in about the procedure that Dr. Gwenlyn Found wants her to have. Patient would like to speak with a nurse about it to get some questions answered. Please give patient a call back.

## 2018-10-12 NOTE — Telephone Encounter (Signed)
Left a message for the patient to call back.  

## 2018-10-12 NOTE — Telephone Encounter (Signed)
DPR on file. Left detailed message regarding cardiac CT instructions and informing pt that message would be routed to device clinic regarding heart monitor. Advised to call back with any questions

## 2018-10-12 NOTE — Telephone Encounter (Signed)
Pt returned call and she states that Dr Gwenlyn Found told her that "we have to slow down your heart rate" and she states that she spoke with "her friend" and he told her that it feels like they are trying to stop your heart and would not want to have it done again. Pt states that she has anxiety disorder and now does not want to have the CT done.  She says that she has not received a call about the monitor and it has been a week. When can she expect to have this done? Please advise

## 2018-10-13 ENCOUNTER — Telehealth: Payer: Self-pay | Admitting: *Deleted

## 2018-10-13 NOTE — Telephone Encounter (Signed)
14 day ZIO XT long term holter monitor to be mailed to the patients home.  Instructions reviewed briefly as they are included in the monitor kit. 

## 2018-11-11 ENCOUNTER — Ambulatory Visit (INDEPENDENT_AMBULATORY_CARE_PROVIDER_SITE_OTHER): Payer: Federal, State, Local not specified - PPO

## 2018-11-11 DIAGNOSIS — R002 Palpitations: Secondary | ICD-10-CM | POA: Diagnosis not present

## 2018-11-11 DIAGNOSIS — R079 Chest pain, unspecified: Secondary | ICD-10-CM | POA: Diagnosis not present

## 2018-11-30 ENCOUNTER — Telehealth: Payer: Self-pay

## 2018-11-30 NOTE — Telephone Encounter (Signed)
Cardiac CTA would give me more information.

## 2018-11-30 NOTE — Telephone Encounter (Signed)
Late entry  Spoke with pt regarding the following referral message:  Lorretta Harp, MD  Annita Brod, RN        Can do Lexi but that involves radiation as well   Previous Messages  ----- Message -----  From: Annita Brod, RN  Sent: 11/25/2018  5:32 PM EST  To: Lorretta Harp, MD, Cain Sieve, RN  Subject: FW: refused ct                  Dr. Gwenlyn Found,   Please advise on the information below.   Thank you,   Chima   ----- Message -----  From: Armando Gang  Sent: 11/25/2018  3:50 PM EST  To: Annita Brod, RN  Subject: refused ct                    Patient don't want to do because she has several ct within the past 10 months. She is very concern about over exposure to increased chance of breast cancer. Want to know if there is any other test to do. Order will be closed.       Discussed lexiscan vs cardiac CT with patient. Pt still apprehensive about proceeding with either test; she states she has generalized anxiety disorder and feels CP and palpitations related to this. She states she would rather just go ahead with heart cath although she knows that non-invasive studies usually precede the more invasive. Pt states she had a stress test back in 2017 that she would like Dr. Gwenlyn Found to review and determine if just a lexiscan is fine or if she should go ahead with cardiac CT. Results of stress echo in Epic under "care everywhere". Pt also states she will try to upload results on mychart.

## 2018-12-05 NOTE — Telephone Encounter (Signed)
DPR on file. Left detailed message regarding Dr. Gwenlyn Found recommendation for cardiac CT for more info on cardiac status. Advised pt to call back to set up cardiac CT or update office on decision on how she would like to proceed

## 2018-12-13 NOTE — Telephone Encounter (Signed)
This encounter was created in error - please disregard.

## 2018-12-14 NOTE — Telephone Encounter (Signed)
Called pt to f/u about Dr Gwenlyn Found recommendations of getting cardiac CT. Pt stated that she was unsure about it. Stated she has bad anxiety and started to have a panic attack. Pt stated that she would think about it and call the office back. Pt also request that our office call her husband, Herbie Baltimore, with all of her results and concerns from now on because her anxiety makes her have panic attacks.

## 2019-01-17 ENCOUNTER — Telehealth: Payer: Self-pay | Admitting: Cardiovascular Disease

## 2019-01-17 NOTE — Telephone Encounter (Signed)
  Patient is calling to request that someone call her husband, Herbie Baltimore, with her heart monitor results. Due to her anxiety she prefers the information be given to her husband instead of her. DPR listing husband is on file.

## 2019-01-17 NOTE — Telephone Encounter (Signed)
Results not in chart yet.

## 2019-01-30 ENCOUNTER — Telehealth: Payer: Self-pay

## 2019-01-30 NOTE — Telephone Encounter (Signed)
-----   Message from Lorretta Harp, MD sent at 01/27/2019  4:56 PM EST ----- Regarding: Zio Patch I saw this pt back in Oct and ordered a 2 week Zio patch for palp which apparently she never obtained. Can you call and check on her and see if she's still having palp and if so why she didn't get the Zio patch?  JJB

## 2019-01-30 NOTE — Telephone Encounter (Signed)
Called and left message regarding her Zio monitor.

## 2019-06-28 ENCOUNTER — Ambulatory Visit (INDEPENDENT_AMBULATORY_CARE_PROVIDER_SITE_OTHER): Payer: Federal, State, Local not specified - PPO | Admitting: Psychiatry

## 2019-06-28 ENCOUNTER — Other Ambulatory Visit: Payer: Self-pay

## 2019-06-28 ENCOUNTER — Encounter: Payer: Self-pay | Admitting: Psychiatry

## 2019-06-28 DIAGNOSIS — F411 Generalized anxiety disorder: Secondary | ICD-10-CM
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
## 2019-07-06 ENCOUNTER — Ambulatory Visit (INDEPENDENT_AMBULATORY_CARE_PROVIDER_SITE_OTHER): Payer: Federal, State, Local not specified - PPO | Admitting: Psychiatry

## 2019-07-06 ENCOUNTER — Other Ambulatory Visit: Payer: Self-pay

## 2019-07-06 ENCOUNTER — Encounter: Payer: Self-pay | Admitting: Psychiatry

## 2019-07-06 DIAGNOSIS — F411 Generalized anxiety disorder: Secondary | ICD-10-CM | POA: Diagnosis not present

## 2019-07-06 NOTE — Progress Notes (Signed)
      [facility] Counselor/Therapist Progress Note  Patient ID: [name] Horn, MRN: [id number],    Date: 07/06/2019  Time Spent: 59 minutes   Treatment Type: Individual Therapy  Reported Symptoms: anxiety  Mental Status Exam:  Appearance:   Well Groomed     Behavior:  Appropriate  Motor:  Normal  Speech/Language:   Clear and Coherent  Affect:  Appropriate  Mood:  anxious  Thought process:  normal  Thought content:    WNL  Sensory/Perceptual disturbances:    WNL  Orientation:  oriented to person, place, time/date and situation  Attention:  Good  Concentration:  Good  Memory:  WNL  Fund of knowledge:   Good  Insight:    Good  Judgment:   Good  Impulse Control:  Good   Risk Assessment: Danger to Self:  No Self-injurious Behavior: No Danger to Others: No Duty to Warn:no Physical Aggression / Violence:No  Access to Firearms a concern: No  Gang Involvement:No   Subjective: The client states that she did much better on a road trip to [place] with her husband last weekend.  She was on a stretch of the road with no exits and she found herself much less anxious than she expected.  Today we focused on her flight to [facility].  Her negative cognition is, "I will not be able to catch my breath."  She feels anxiety in her breathing.  Her subjective units of distress is a 7.  As the client processed she discussed the different aspects of flight and where she gets anxious.  The closing of the door, the waiting to take off, and where she sits on the plane.  The client hopes that she can sit next to someone that she can talk to.  I pointed out as the client discussed that the distraction from talking to someone else would be very helpful for her.  I also pointed out that when she was talking about her driving last weekend she did a lot of positive self talk to get her through the little bit of anxiety that she had.  The client agreed.  I asked her to be more mindful about this when  she gets on the plane next week.  She agreed to do so.  We also discussed downloading some podcasts or a book to listen to to keep her occupied or bringing a book or puzzles to do on the plane.  The client also plans to take Klonopin before she starts the flight.  As the client continued to process her subjective units of distress was less than 2.  She had a better handle on facing the plane flight.  Interventions: Assertiveness/Communication, Mindfulness Meditation, Motivational Interviewing, Solution-Oriented/Positive Psychology, [facility] Group Desensitization and Reprocessing (EMDR) and Insight-Oriented  Diagnosis:   ICD-10-CM   1. Generalized anxiety disorder  F41.1     Plan: Mindfulness, mood independent behavior, distraction, assertiveness, boundaries, self-care, positive self talk.  Braden Deloach, [facility]

## 2019-07-12 ENCOUNTER — Encounter: Payer: Self-pay | Admitting: Psychiatry

## 2019-07-12 ENCOUNTER — Ambulatory Visit (INDEPENDENT_AMBULATORY_CARE_PROVIDER_SITE_OTHER): Payer: Federal, State, Local not specified - PPO | Admitting: Psychiatry

## 2019-07-12 ENCOUNTER — Other Ambulatory Visit: Payer: Self-pay

## 2019-07-12 DIAGNOSIS — F411 Generalized anxiety disorder: Secondary | ICD-10-CM | POA: Diagnosis not present

## 2019-07-12 NOTE — Progress Notes (Signed)
      [facility] Counselor/Therapist Progress Note  Patient ID: [name] Horn, MRN: [id number],    Date: 07/12/2019  Time Spent: 55 minutes   Treatment Type: Individual Therapy  Reported Symptoms: anxiety  Mental Status Exam:  Appearance:   Well Groomed     Behavior:  Appropriate  Motor:  Normal  Speech/Language:   Clear and Coherent  Affect:  Appropriate  Mood:  anxious  Thought process:  normal  Thought content:    WNL  Sensory/Perceptual disturbances:    WNL  Orientation:  oriented to person, place, time/date and situation  Attention:  Good  Concentration:  Good  Memory:  WNL  Fund of knowledge:   Good  Insight:    Good  Judgment:   Good  Impulse Control:  Good   Risk Assessment: Danger to Self:  No Self-injurious Behavior: No Danger to Others: No Duty to Warn:no Physical Aggression / Violence:No  Access to Firearms a concern: No  Gang Involvement:No   Subjective: Tomorrow the client is flying to [facility].  Today her anxiety about that flight is at a subjective units of distress of 9.  Today I used eye-movement with the client focusing on her anxiety around her flight.  Her negative cognition is, "I will be stuck on the tarmac."  She feels the anxiety in her chest and her breath.  As the client processed she identified all the catastrophic thoughts going through her head.  It is around her fear of feeling trapped and the anxiety causing her to lose her breath.  "I cannot do anything."  As we continued to process the client's subjective units of distress came down to around a 7.  The client will be traveling with her husband.  I asked the client "is your husband safe?"  She acknowledged that he is safe.  I then asked the question why would you not be safe?  The client had no reasoning about why she would not be safe.  As we continued with eye-movement her anxiety began to come down.  "I guess I am."  We discussed the things the client could do to help manage her  anxiety.  She will take her Klonopin an hour before the flight.  She will have a book, a podcast, or a movie to watch on the plane.  She can also use deep breathing if she feels she is short of breath.  She will also have other things she can do to distract herself.  The client also plans to be one of the last people on the plane to minimize the time that she has to sit on the runway.  Her positive cognition at the end of the session was, "I am stronger than I think".  Her subjective units of distress was 3.  Interventions: Assertiveness/Communication, Mindfulness Meditation, Motivational Interviewing, Solution-Oriented/Positive Psychology, [facility] Group Desensitization and Reprocessing (EMDR) and Insight-Oriented  Diagnosis:   ICD-10-CM   1. Generalized anxiety disorder  F41.1     Plan: Use distraction strategies, positive self talk, assertiveness, boundaries, self-care, deep breathing.  Ernestine Langworthy, [facility]

## 2019-08-02 ENCOUNTER — Other Ambulatory Visit: Payer: Self-pay

## 2019-08-02 ENCOUNTER — Ambulatory Visit (INDEPENDENT_AMBULATORY_CARE_PROVIDER_SITE_OTHER): Payer: Federal, State, Local not specified - PPO | Admitting: Psychiatry

## 2019-08-02 DIAGNOSIS — F411 Generalized anxiety disorder: Secondary | ICD-10-CM | POA: Diagnosis not present
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
## 2019-08-03 ENCOUNTER — Encounter: Payer: Self-pay | Admitting: Psychiatry

## 2019-08-09 ENCOUNTER — Other Ambulatory Visit: Payer: Self-pay

## 2019-08-09 ENCOUNTER — Ambulatory Visit (INDEPENDENT_AMBULATORY_CARE_PROVIDER_SITE_OTHER): Payer: Federal, State, Local not specified - PPO | Admitting: Psychiatry

## 2019-08-09 DIAGNOSIS — F411 Generalized anxiety disorder: Secondary | ICD-10-CM | POA: Diagnosis not present

## 2019-08-09 NOTE — Progress Notes (Signed)
[facility] Counselor/Therapist Progress Note  Patient ID: NYOMIE EHRLICH, MRN: [id number],    Date: 08/10/2019  Time Spent: 50 minutes   Treatment Type: Individual Therapy  Reported Symptoms: anxiety  Mental Status Exam:  Appearance:   Casual     Behavior:  Appropriate  Motor:  Normal  Speech/Language:   Clear and Coherent  Affect:  Appropriate  Mood:  anxious  Thought process:  normal  Thought content:    WNL  Sensory/Perceptual disturbances:    WNL  Orientation:  oriented to person, place, time/date and situation  Attention:  Good  Concentration:  Good  Memory:  WNL  Fund of knowledge:   Good  Insight:    Good  Judgment:   Good  Impulse Control:  Good   Risk Assessment: Danger to Self:  No Self-injurious Behavior: No Danger to Others: No Duty to Warn:no Physical Aggression / Violence:No  Access to Firearms a concern: No  Gang Involvement:No   Subjective: The client reports that things are better.  Her anxiety was higher earlier in the week.  "It seems to be related to work."  The client states that she took a walk and within a little bit felt a release in her chest.  I encouraged the client to continue walking on a more regular basis as a way to manage and decrease her anxiety.  I explained that walking is a type of bilateral stimulation which is similar to the rapid eye-movement that we do in session.  The client understood this and agreed to do so. Today I used eye-movement with the client focusing on her work.  Her negative cognition is, "I will be judged.  I do not want to let people down."  She feels anxiety in her chest.  Her subjective units of distress is a 6+.  As the client processed she realized that so much of her fear of judgment comes from her childhood.  Both of her siblings were significantly older than her i.e. 20 years.  As a result she never thought she was smart.  I explained to the client that at her young age her developmental style of  thinking was magical thinking.  This means that everything that happens to her is either somehow about her or caused by her.  I explained to the client that this is where she can easily integrate a negative message internally.  This can go underground and then begin to come out in her adult behavior.  This makes sense to the client.  As she continued to process, there were things she remembered where she was acknowledged as being smart.  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  I asked the client if they are paying her the manager salary out of kindness?  She said no.  I pointed out that they are paying her for her knowledge and expertise.  This made sense to the client.  I encouraged the client to think about trusting her judgment.  The client agreed.  Her positive cognition at the end of the session was, "I have to trust my judgment."  She also realized that she does not always have to be on guard.  Interventions: Assertiveness/Communication, Motivational Interviewing, Solution-Oriented/Positive Psychology, [facility] Group Desensitization and Reprocessing (EMDR) and Insight-Oriented  Diagnosis:   ICD-10-CM   1. Generalized anxiety disorder  F41.1     Plan: Mood independent behavior, positive self talk, self-care, walking, assertiveness, boundaries.  Montanna Mcbain, [facility]

## 2019-08-10 ENCOUNTER — Encounter: Payer: Self-pay | Admitting: Psychiatry

## 2019-09-05 ENCOUNTER — Ambulatory Visit: Payer: Federal, State, Local not specified - PPO | Admitting: Psychiatry

## 2019-09-13 ENCOUNTER — Ambulatory Visit: Payer: Federal, State, Local not specified - PPO | Admitting: Psychiatry

## 2019-09-25 ENCOUNTER — Ambulatory Visit: Payer: Federal, State, Local not specified - PPO | Admitting: Psychiatry

## 2019-10-09 ENCOUNTER — Ambulatory Visit (INDEPENDENT_AMBULATORY_CARE_PROVIDER_SITE_OTHER): Payer: Federal, State, Local not specified - PPO | Admitting: Psychiatry

## 2019-10-09 ENCOUNTER — Encounter: Payer: Self-pay | Admitting: Psychiatry

## 2019-10-09 ENCOUNTER — Ambulatory Visit: Payer: Federal, State, Local not specified - PPO | Admitting: Psychiatry

## 2019-10-09 DIAGNOSIS — F411 Generalized anxiety disorder: Secondary | ICD-10-CM

## 2019-10-09 NOTE — Progress Notes (Signed)
      [facility] Counselor/Therapist Progress Note  Patient ID: [name] Horn, MRN: [id number],    Date: 10/09/2019  Time Spent: 50 minutes   Treatment Type: Individual Therapy  Reported Symptoms: anxiety  Mental Status Exam:  Appearance:   Well Groomed     Behavior:  Appropriate  Motor:  Normal  Speech/Language:   Clear and Coherent  Affect:  Appropriate  Mood:  anxious  Thought process:  normal  Thought content:    WNL  Sensory/Perceptual disturbances:    WNL  Orientation:  oriented to person, place, time/date and situation  Attention:  Good  Concentration:  Good  Memory:  WNL  Fund of knowledge:   Good  Insight:    Good  Judgment:   Good  Impulse Control:  Good   Risk Assessment: Danger to Self:  No Self-injurious Behavior: No Danger to Others: No Duty to Warn:no Physical Aggression / Violence:No  Access to Firearms a concern: No  Gang Involvement:No   Subjective: The client states that she has been told by her supervisor that she will probably have to be vaccinated by December 04, 2019.  The client has always been afraid of the medicine and especially shots.  She is uncertain about the new vaccine for the coronavirus.  Since that is new technology she is concerned about the impact.  She is also afraid of losing her job.  Today the client's anxiety was at a subjective units of distress of 6.  She discussed writing for an exemption because of her phobia of taking shots.  We recently work through some of the anxiety the client has around following.  She still has anxiety but is more skillful in managing it. I discussed with the client what it would take to work through this.  The client does not want to be injected but is fine with regular testing.  Apparently that is not part of what is acceptable for the government with the new vaccine mandate.  The client will be due next week face-to-face so we can continue to work through her anxiety around this.  She is also  fearful about losing her job and what that means for her retirement.  Her husband is also in the same position.  Interventions: Assertiveness/Communication, Motivational Interviewing, Solution-Oriented/Positive Psychology and Insight-Oriented  Diagnosis:   ICD-10-CM   1. Generalized anxiety disorder  F41.1     Plan: Radical acceptance, mood independent behavior, self-care, positive self talk.  Manar Smalling, [facility]

## 2019-10-18 ENCOUNTER — Encounter: Payer: Self-pay | Admitting: Psychiatry

## 2019-10-18 ENCOUNTER — Other Ambulatory Visit: Payer: Self-pay

## 2019-10-18 ENCOUNTER — Ambulatory Visit (INDEPENDENT_AMBULATORY_CARE_PROVIDER_SITE_OTHER): Payer: Federal, State, Local not specified - PPO | Admitting: Psychiatry

## 2019-10-18 DIAGNOSIS — F411 Generalized anxiety disorder: Secondary | ICD-10-CM

## 2019-10-18 DIAGNOSIS — F40298 Other specified phobia: Secondary | ICD-10-CM | POA: Diagnosis not present

## 2019-10-18 NOTE — Progress Notes (Signed)
      [facility] Counselor/Therapist Progress Note  Patient ID: [name] Horn, MRN: [id number],    Date: 10/18/2019  Time Spent: 50 minutes   Treatment Type: Individual Therapy  Reported Symptoms: anxiety, fear of shots, rumination  Mental Status Exam:  Appearance:   Well Groomed     Behavior:  Appropriate  Motor:  Normal  Speech/Language:   Clear and Coherent  Affect:  Appropriate  Mood:  anxious  Thought process:  normal  Thought content:    Rumination  Sensory/Perceptual disturbances:    WNL  Orientation:  oriented to person, place, time/date and situation  Attention:  Good  Concentration:  Good  Memory:  WNL  Fund of knowledge:   Good  Insight:    Good  Judgment:   Good  Impulse Control:  Good   Risk Assessment: Danger to Self:  No Self-injurious Behavior: No Danger to Others: No Duty to Warn:no Physical Aggression / Violence:No  Access to Firearms a concern: No  Gang Involvement:No   Subjective: The client discloses that she has a longstanding phobia of shots.  This extends back to her childhood.  She recently was told that they are moving forward with the vaccine mandate at the [employer].  The client states that if she gets the shot she will ruminate on what could happen to her all the time.  "It will drive me crazy."  At the client's request I provided her a letter with her diagnoses.  I believe that taking the shot is contraindicated for her mental health.  I believe that weekly testing would be the most appropriate intervention.  The client also works remotely so she is not in the office. The client is fearful of losing her job.  We discussed all the possible consequences.  I used eye-movement with the client focusing on her anxiety.  Her negative cognition is, "I fear the shot."  She feels anxiety in her chest.  Her subjective units of distress is a 6.  As the client processed she realized that losing her job would not be the most terrible thing in the world although  it would not be ideal.  She felt much calmer at the end of the session.  Her subjective units of distress is a 2.  The client believes that she still could not take the shot.  Interventions: Assertiveness/Communication, Motivational Interviewing, Solution-Oriented/Positive Psychology, [employer] [facility] Desensitization and Reprocessing (EMDR) and Insight-Oriented  Diagnosis:   ICD-10-CM   1. Generalized anxiety disorder  F41.1   2. Specific phobia  F40.298     Plan: Boundaries, assertiveness, self-care, positive self talk, exercise.  Emerick Weatherly, [facility]

## 2019-11-01 ENCOUNTER — Ambulatory Visit: Payer: Federal, State, Local not specified - PPO | Admitting: Psychiatry

## 2019-11-02 ENCOUNTER — Telehealth: Payer: Self-pay | Admitting: Psychiatry

## 2019-11-02 DIAGNOSIS — Z0289 Encounter for other administrative examinations: Secondary | ICD-10-CM

## 2019-11-02 NOTE — Telephone Encounter (Signed)
Received by fax a Reasonable Accomodation Form from patient on 11/02/19

## 2019-11-16 ENCOUNTER — Ambulatory Visit: Payer: Federal, State, Local not specified - PPO | Admitting: Psychiatry

## 2019-11-23 DIAGNOSIS — U071 COVID-19: Secondary | ICD-10-CM | POA: Diagnosis not present

## 2019-11-29 ENCOUNTER — Ambulatory Visit (INDEPENDENT_AMBULATORY_CARE_PROVIDER_SITE_OTHER): Payer: Federal, State, Local not specified - PPO | Admitting: Psychiatry

## 2019-11-29 ENCOUNTER — Encounter: Payer: Self-pay | Admitting: Psychiatry

## 2019-11-29 DIAGNOSIS — F411 Generalized anxiety disorder: Secondary | ICD-10-CM | POA: Diagnosis not present

## 2019-11-29 NOTE — Progress Notes (Signed)
[facility] Counselor/Therapist Progress Note  Patient ID: GENTRI GUARDADO, MRN: [id number],    Date: 11/29/2019  Time Spent: 50 minutes   Treatment Type: Individual Therapy  Reported Symptoms: sad  Mental Status Exam:  Appearance:   Casual     Behavior:  Appropriate  Motor:  Normal  Speech/Language:   Clear and Coherent  Affect:  Appropriate  Mood:  sad  Thought process:  normal  Thought content:    WNL  Sensory/Perceptual disturbances:    WNL  Orientation:  oriented to person, place, time/date and situation  Attention:  Good  Concentration:  Good  Memory:  WNL  Fund of knowledge:   Good  Insight:    Good  Judgment:   Good  Impulse Control:  Good   Risk Assessment: Danger to Self:  No Self-injurious Behavior: No Danger to Others: No Duty to Warn:no Physical Aggression / Violence:No  Access to Firearms a concern: No  Gang Involvement:No   Subjective: Ms. vayla, wilhelmi are scheduled for a virtual visit with your provider today.   Just as we do with appointments in the office, we must obtain your consent to participate.  Your consent will be active for this visit and any virtual visit you Louellen Haldeman have with one of our providers in the next 365 days.   If you have a MyChart account, I can also send a copy of this consent to you electronically.  All virtual visits are billed to your insurance company just like a traditional visit in the office.  As this is a virtual visit, video technology does not allow for your provider to perform a traditional examination.  This Chinedum Vanhouten limit your provider's ability to fully assess your condition.  If your provider identifies any concerns that need to be evaluated in person or the need to arrange testing such as labs, EKG, etc, we will make arrangements to do so.   Although advances in technology are sophisticated, we cannot ensure that it will always work on either your end or our end.  If the connection with a video visit is poor, we Terriyah Westra  have to switch to a telephone visit.  With either a video or telephone visit, we are not always able to ensure that we have a secure connection.   I need to obtain your verbal consent now.   Are you willing to proceed with your visit today?  Deloria Brassfield Brendel has provided verbal consent on 11/29/2019 for a virtual visit (video or telephone). Albertina Parr Navada Osterhout, [facility] 11/29/2019  4:09 PM  I connected with this patient by an approved telecommunication method (audio only), with her informed consent, and verifying identity and patient privacy.  I was located at my office and patient at her home.  As needed, we discussed the limitations, risks, and security and privacy concerns associated with telehealth service, including the availability and conditions which currently govern in-person appointments and the possibility that 3rd-party payment Saryah Loper not be fully guaranteed and she Anavictoria Wilk be responsible for charges.  After she indicated understanding, we proceeded with the session.  Also discussed treatment planning, as needed, including ongoing verbal agreement with the plan, the opportunity to ask and answer all questions, her demonstrated understanding of instructions, and her readiness to call the office should symptoms worsen or she feels she is in a crisis state and needs more immediate and tangible assistance.  The client states that she and her husband contracted COVID right after Halloween.  They had been at  a small gathering at their daughter's house.  The client and her husband are unvaccinated but had a prescription for ivermectin which they used successfully.  The client states she has been out of work but exceptionally tired.  Today she describes feeling sad.  "Things are going so poorly in the world."  I asked the client if she was watching cable news?  She said that she was.  I encouraged the client to turn off the cable news because they are just going to report on the most negative things.  Things get couched  in such a way to raise people's anxiety and concerns because that increases their ad revenue.  The client agreed.  I asked the client what she could do instead?  We discussed reading some novels.  She could also watch some of the old movies that she enjoys.  I also encouraged the client to go outside and to sit in the sun.  She might want to increase her vitamin D levels since she has had COVID.  The client feels that this might be a good idea.  She also admits to possibly having a seasonal affective disorder.  I suggested to the client that she purchase a mood light to use at home.  I sent the client a copy of the morning evening questionnaire to complete.  This will give her the best time for her to get up in the morning.  It also has instructions on how to use and position a mood light for the best results.  The client stated that she would follow through with this. I also encouraged the client to pursue her spiritual life more since she has been so discouraged.  The client stated that she would but did not know where to start reading in her Bible.  I suggested she start with the book of Psalms since Shanon Brow was pretty discouraged as well.  The client agreed and will pursue this also.       Interventions: Assertiveness/Communication, Motivational Interviewing, Solution-Oriented/Positive Psychology and Insight-Oriented  Diagnosis:   ICD-10-CM   1. Generalized anxiety disorder  F41.1     Plan: Mood independent behavior, set outside, increase vitamin D supplementation, reading, spiritual practices, completing the morning evening questionnaire, purchase a mood light and use.  Gustie Bobb, [facility]

## 2019-12-22 ENCOUNTER — Ambulatory Visit: Payer: Federal, State, Local not specified - PPO | Admitting: Psychiatry

## 2020-01-02 DIAGNOSIS — Z124 Encounter for screening for malignant neoplasm of cervix: Secondary | ICD-10-CM | POA: Diagnosis not present

## 2020-01-02 DIAGNOSIS — Z1151 Encounter for screening for human papillomavirus (HPV): Secondary | ICD-10-CM | POA: Diagnosis not present

## 2020-01-02 DIAGNOSIS — Z01419 Encounter for gynecological examination (general) (routine) without abnormal findings: Secondary | ICD-10-CM | POA: Diagnosis not present

## 2020-01-02 DIAGNOSIS — Z130 Encounter for screening for diseases of the blood and blood-forming organs and certain disorders involving the immune mechanism: Secondary | ICD-10-CM | POA: Diagnosis not present

## 2020-02-26 ENCOUNTER — Telehealth: Payer: Self-pay | Admitting: Gastroenterology

## 2020-02-26 NOTE — Telephone Encounter (Signed)
I left a detailed message for the patient that the timing of the next colonoscopy will be determined by the size, location, and type of polyp removed at the time of the colonoscopy.  I asked that she call back for any additional questions or concerns.

## 2020-02-26 NOTE — Telephone Encounter (Signed)
Pt is requesting a call back from a nurse, pt wants to know if a polyp is found on the next colonoscopy would the procedure change to every 3 or 5 years,

## 2020-03-06 DIAGNOSIS — R253 Fasciculation: Secondary | ICD-10-CM | POA: Diagnosis not present

## 2020-03-25 ENCOUNTER — Ambulatory Visit: Payer: Federal, State, Local not specified - PPO | Admitting: Psychiatry

## 2020-04-04 IMAGING — MG DIGITAL SCREENING BILATERAL MAMMOGRAM WITH TOMO AND CAD
8 series · 8 of 24 positions shown · non-contrast
Comparison: Previous exam(s).

CLINICAL DATA: Screening.

EXAM:
DIGITAL SCREENING BILATERAL MAMMOGRAM WITH TOMO AND CAD

[R CC synth-2D]
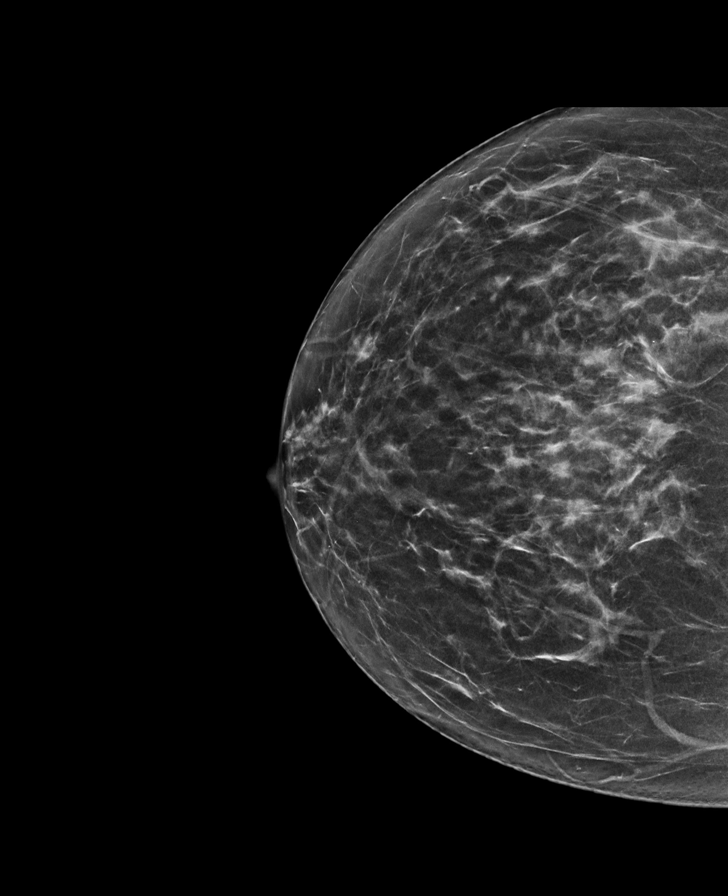

[R MLO synth-2D]
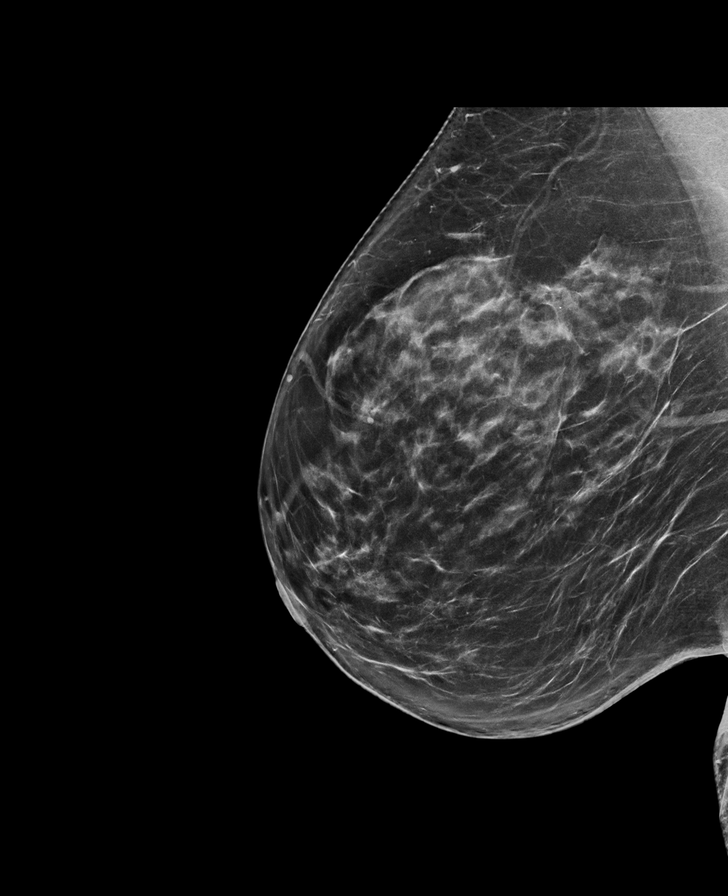

[L CC synth-2D]
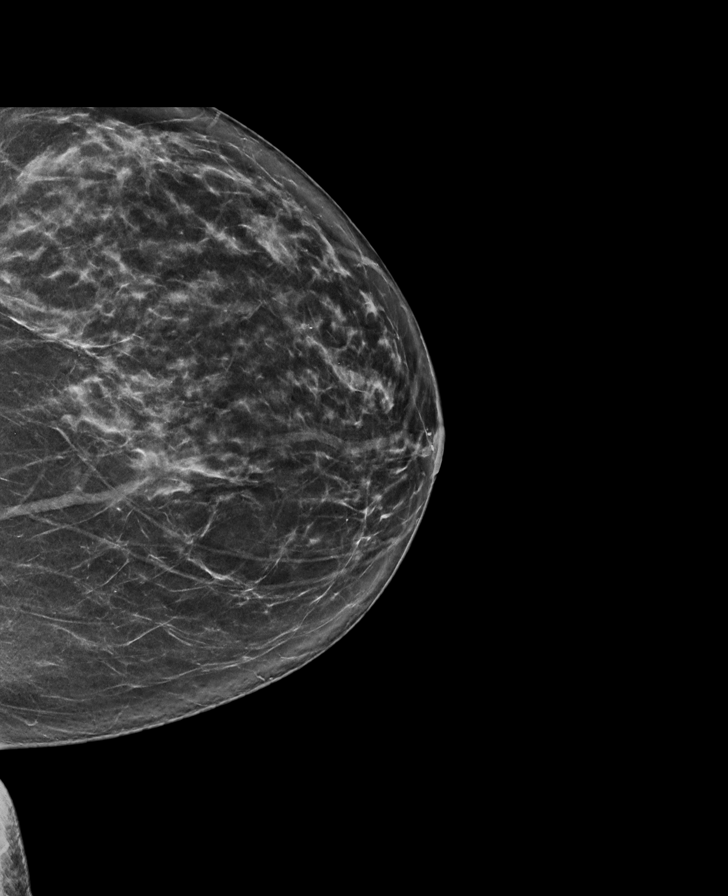

[L MLO synth-2D]
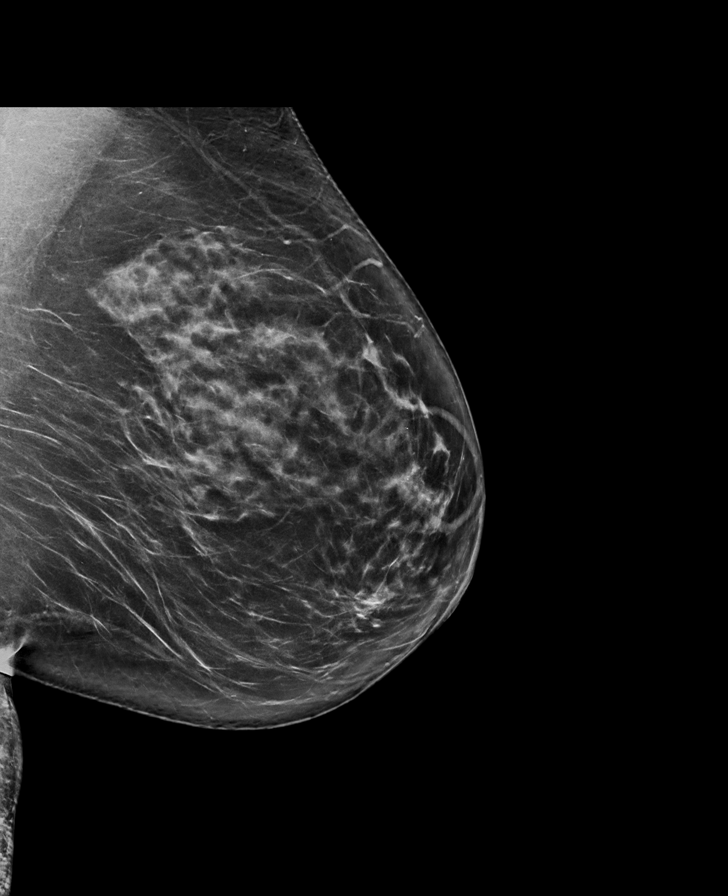

[L CC tomo · tomo slice 37/73.0]
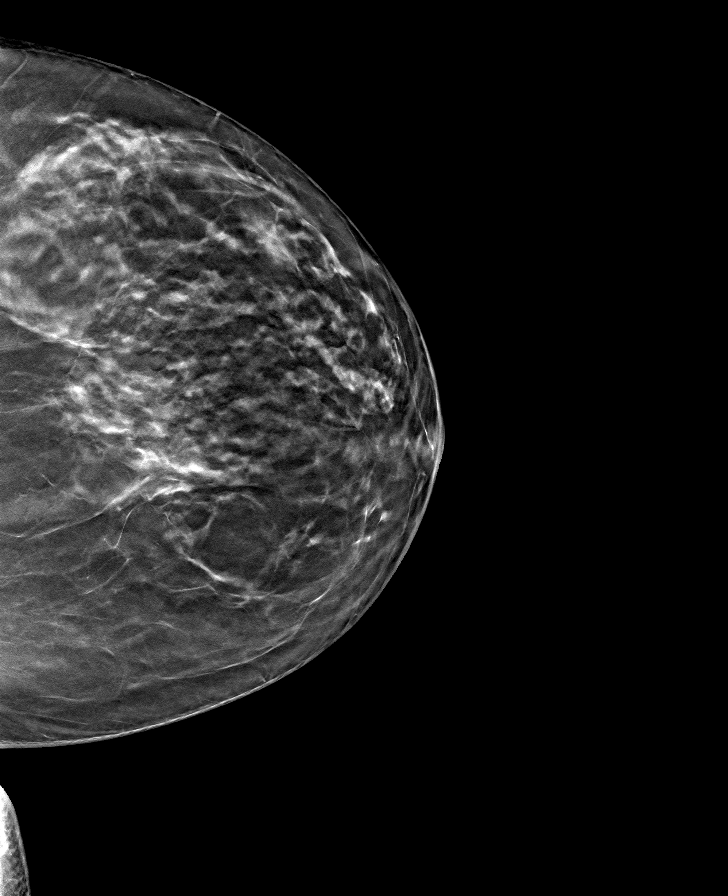

[L MLO tomo · tomo slice 44/87.0]
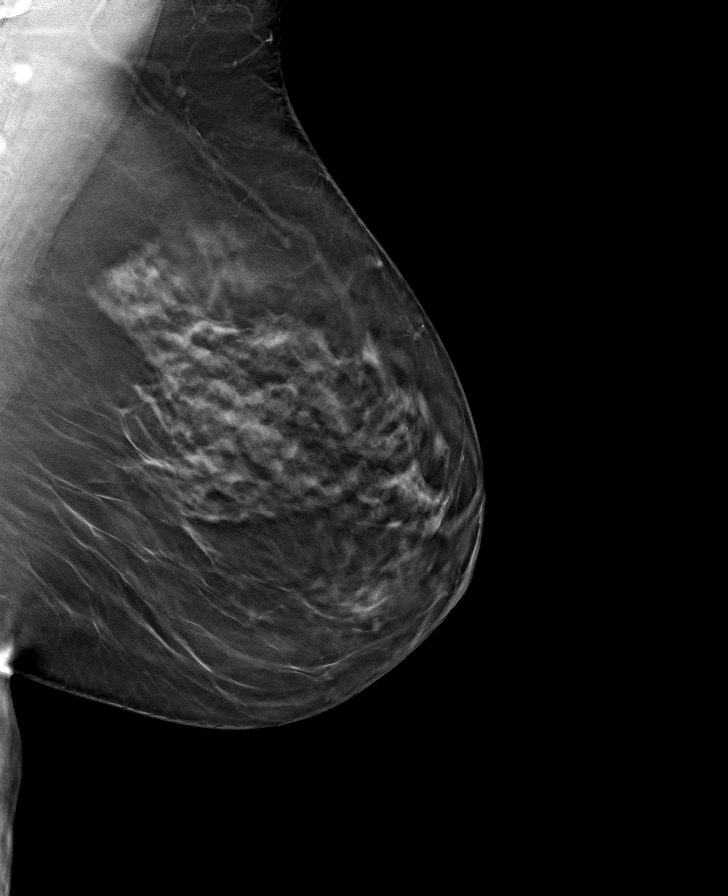

[R MLO tomo · tomo slice 41/82.0]
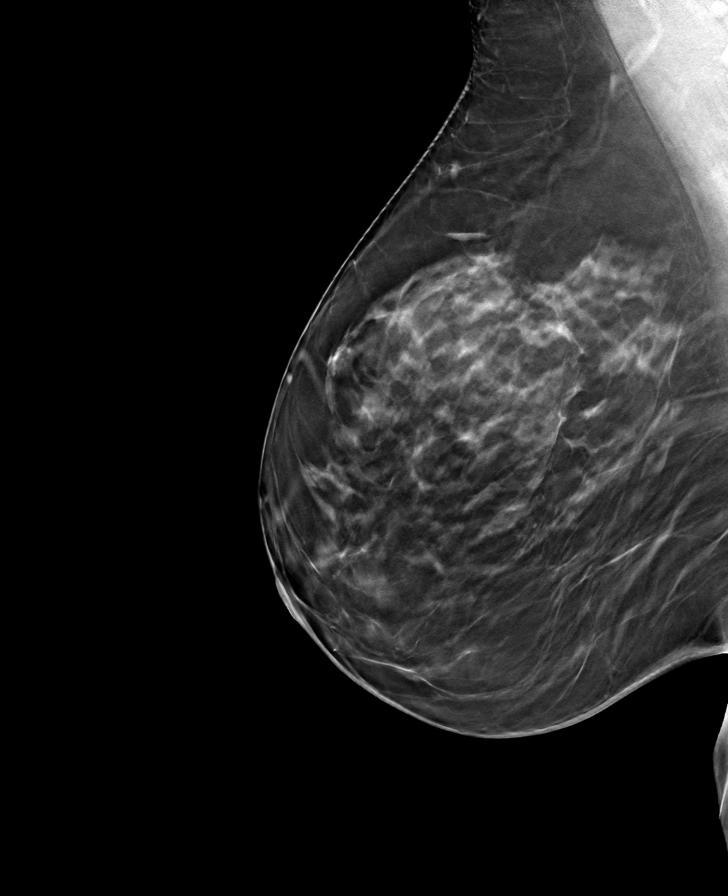

[R CC tomo · tomo slice 33/66.0]
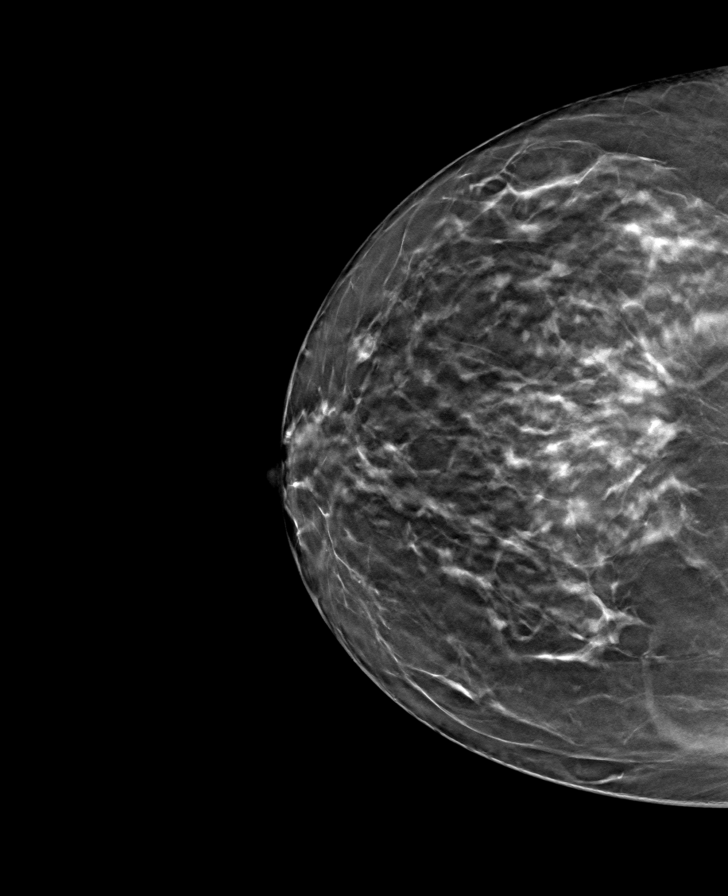

[8 of 24 positions shown; findings below may reference images not displayed]

ACR Breast Density Category b: There are scattered areas of
fibroglandular density.
FINDINGS: There are no findings suspicious for malignancy. Images were
processed with CAD.
IMPRESSION: No mammographic evidence of malignancy. A result letter of this
screening mammogram will be mailed directly to the patient.

RECOMMENDATION:
Screening mammogram in one year. (Code:[id number])

BI-RADS CATEGORY  1: Negative.

## 2020-04-23 ENCOUNTER — Telehealth: Payer: Self-pay | Admitting: Psychiatry

## 2020-04-23 NOTE — Telephone Encounter (Signed)
I tried to call her to get more info but she said she was busy and will give [place] a call back.

## 2020-04-23 NOTE — Telephone Encounter (Signed)
She returned my call.She said she wants you to write a letter to her job stating due to her increased stress and anxiety she thinks it's best for her to step down to a previous position.She said she can wait until her appt with you to get the letter.

## 2020-04-23 NOTE — Telephone Encounter (Signed)
[name] Horn called and would like a letter for her employer regarding a change of position. Can you please call her at [phone].

## 2020-05-06 ENCOUNTER — Other Ambulatory Visit: Payer: Self-pay

## 2020-05-06 ENCOUNTER — Ambulatory Visit (INDEPENDENT_AMBULATORY_CARE_PROVIDER_SITE_OTHER): Payer: Federal, State, Local not specified - PPO | Admitting: Psychiatry

## 2020-05-06 ENCOUNTER — Encounter: Payer: Self-pay | Admitting: Psychiatry

## 2020-05-06 DIAGNOSIS — F411 Generalized anxiety disorder: Secondary | ICD-10-CM | POA: Diagnosis not present

## 2020-05-06 NOTE — Progress Notes (Signed)
      [facility] Counselor/Therapist Progress Note  Patient ID: [name] Horn, MRN: [id number],    Date: 05/06/2020  Time Spent: 45 minutes   Treatment Type: Individual Therapy  Reported Symptoms: anxiety  Mental Status Exam:  Appearance:   Casual     Behavior:  Appropriate  Motor:  Normal  Speech/Language:   Clear and Coherent  Affect:  Appropriate  Mood:  anxious  Thought process:  normal  Thought content:    WNL  Sensory/Perceptual disturbances:    WNL  Orientation:  oriented to person, place, time/date and situation  Attention:  Good  Concentration:  Good  Memory:  WNL  Fund of knowledge:   Good  Insight:    Good  Judgment:   Good  Impulse Control:  Good   Risk Assessment: Danger to Self:  No Self-injurious Behavior: No Danger to Others: No Duty to Warn:no Physical Aggression / Violence:No  Access to Firearms a concern: No  Gang Involvement:No   Subjective: The client is very sad and anxious today.  Her 59 year old dog is sick.  She will have to take him to the vet this afternoon.  The client is also upset that I am retiring.  Today we focused on the client's job stress as a [employer]er with the [employer].  She states that she gets emails every day not knowing what to expect that are very stressful.  She also describes a lot of conflict with her husband.  We started with eye movement today focusing on these issue.  Her negative cognition is, "I do not know what to expect."  She feels anxiety in her chest.  Her subjective units of distress is a 7.  As the client processed she focused more on her husband being mean to her.  She states they have conflicts all the time.  Her job is overwhelming.  "I feel so helpless".  She is fearful of growing older.  I discussed with the client that a new article came out that people who walk regularly live up to 16 years longer and also helps with age related memory loss as well.  The client plans on starting back walking.  I also discussed  using the bilateral stimulation wristbands through the touch points [email or web address].  I also suggested that she purchase [name] Horn's book, "getting past your past".  This is how to do EMDR at home.  The client has contacted [name] Horn, [name] Horn and hopes to get a call back from him soon.  I also sent a text message to [name] Horn to facilitate that for the client.  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  She would need that for her work.  I agreed to get that sent to her. Client's SUDS was at less than 2 at the end of the session.  Interventions: Assertiveness/Communication, Motivational Interviewing, Solution-Oriented/Positive Psychology, [employer] [facility] Desensitization and Reprocessing (EMDR) and Insight-Oriented  Diagnosis:   ICD-10-CM   1. Generalized anxiety disorder  F41.1     Plan: Letter to [employer], self care, walking, boundaries, assertiveness, positive self talk, wristbands, "Getting Past Your Past"  [name] Horn, [facility]

## 2020-05-15 ENCOUNTER — Other Ambulatory Visit: Payer: Self-pay | Admitting: Obstetrics and Gynecology

## 2020-05-15 DIAGNOSIS — N644 Mastodynia: Secondary | ICD-10-CM

## 2020-05-16 ENCOUNTER — Other Ambulatory Visit: Payer: Self-pay

## 2020-05-16 ENCOUNTER — Ambulatory Visit
Admission: RE | Admit: 2020-05-16 | Discharge: 2020-05-16 | Disposition: A | Discharge status: Home / Self Care | Payer: Federal, State, Local not specified - PPO | Source: Ambulatory Visit | Attending: Obstetrics and Gynecology | Admitting: Obstetrics and Gynecology

## 2020-05-16 ENCOUNTER — Ambulatory Visit: Payer: Federal, State, Local not specified - PPO

## 2020-05-16 DIAGNOSIS — N644 Mastodynia: Secondary | ICD-10-CM

## 2020-05-29 DIAGNOSIS — M25561 Pain in right knee: Secondary | ICD-10-CM | POA: Diagnosis not present

## 2020-05-31 DIAGNOSIS — H2513 Age-related nuclear cataract, bilateral: Secondary | ICD-10-CM | POA: Diagnosis not present

## 2020-06-11 DIAGNOSIS — M25561 Pain in right knee: Secondary | ICD-10-CM | POA: Diagnosis not present

## 2020-07-26 DIAGNOSIS — F410 Panic disorder [episodic paroxysmal anxiety] without agoraphobia: Secondary | ICD-10-CM | POA: Diagnosis not present

## 2020-09-24 DIAGNOSIS — D2222 Melanocytic nevi of left ear and external auricular canal: Secondary | ICD-10-CM | POA: Diagnosis not present

## 2020-09-24 DIAGNOSIS — L821 Other seborrheic keratosis: Secondary | ICD-10-CM | POA: Diagnosis not present

## 2020-09-24 DIAGNOSIS — L570 Actinic keratosis: Secondary | ICD-10-CM | POA: Diagnosis not present

## 2020-09-24 DIAGNOSIS — D2262 Melanocytic nevi of left upper limb, including shoulder: Secondary | ICD-10-CM | POA: Diagnosis not present

## 2020-10-09 DIAGNOSIS — F410 Panic disorder [episodic paroxysmal anxiety] without agoraphobia: Secondary | ICD-10-CM | POA: Diagnosis not present

## 2020-10-29 DIAGNOSIS — F410 Panic disorder [episodic paroxysmal anxiety] without agoraphobia: Secondary | ICD-10-CM | POA: Diagnosis not present

## 2020-12-02 DIAGNOSIS — D225 Melanocytic nevi of trunk: Secondary | ICD-10-CM | POA: Diagnosis not present

## 2020-12-02 DIAGNOSIS — L2089 Other atopic dermatitis: Secondary | ICD-10-CM | POA: Diagnosis not present

## 2020-12-02 DIAGNOSIS — L820 Inflamed seborrheic keratosis: Secondary | ICD-10-CM | POA: Diagnosis not present

## 2020-12-02 DIAGNOSIS — D2262 Melanocytic nevi of left upper limb, including shoulder: Secondary | ICD-10-CM | POA: Diagnosis not present

## 2020-12-12 DIAGNOSIS — F410 Panic disorder [episodic paroxysmal anxiety] without agoraphobia: Secondary | ICD-10-CM | POA: Diagnosis not present

## 2021-01-27 DIAGNOSIS — D3132 Benign neoplasm of left choroid: Secondary | ICD-10-CM | POA: Diagnosis not present

## 2021-03-04 DIAGNOSIS — R002 Palpitations: Secondary | ICD-10-CM | POA: Diagnosis not present

## 2021-03-04 DIAGNOSIS — E782 Mixed hyperlipidemia: Secondary | ICD-10-CM | POA: Diagnosis not present

## 2021-03-18 ENCOUNTER — Other Ambulatory Visit: Payer: Self-pay | Admitting: Obstetrics and Gynecology

## 2021-03-18 DIAGNOSIS — N644 Mastodynia: Secondary | ICD-10-CM

## 2021-03-20 ENCOUNTER — Ambulatory Visit
Admission: RE | Admit: 2021-03-20 | Discharge: 2021-03-20 | Disposition: A | Discharge status: Home / Self Care | Payer: Federal, State, Local not specified - PPO | Source: Ambulatory Visit | Attending: Obstetrics and Gynecology | Admitting: Obstetrics and Gynecology

## 2021-03-20 ENCOUNTER — Ambulatory Visit: Payer: Federal, State, Local not specified - PPO

## 2021-03-20 ENCOUNTER — Other Ambulatory Visit: Payer: Self-pay | Admitting: Obstetrics and Gynecology

## 2021-03-20 DIAGNOSIS — N644 Mastodynia: Secondary | ICD-10-CM

## 2021-03-20 DIAGNOSIS — R922 Inconclusive mammogram: Secondary | ICD-10-CM | POA: Diagnosis not present

## 2021-03-31 ENCOUNTER — Other Ambulatory Visit: Payer: Federal, State, Local not specified - PPO

## 2021-04-12 DIAGNOSIS — R002 Palpitations: Secondary | ICD-10-CM

## 2021-04-12 HISTORY — DX: Palpitations: R00.2

## 2021-04-16 DIAGNOSIS — F410 Panic disorder [episodic paroxysmal anxiety] without agoraphobia: Secondary | ICD-10-CM | POA: Diagnosis not present

## 2021-04-18 DIAGNOSIS — R002 Palpitations: Secondary | ICD-10-CM | POA: Diagnosis not present

## 2021-05-01 DIAGNOSIS — F410 Panic disorder [episodic paroxysmal anxiety] without agoraphobia: Secondary | ICD-10-CM | POA: Diagnosis not present

## 2021-05-02 DIAGNOSIS — I471 Supraventricular tachycardia: Secondary | ICD-10-CM | POA: Diagnosis not present

## 2021-05-02 DIAGNOSIS — F410 Panic disorder [episodic paroxysmal anxiety] without agoraphobia: Secondary | ICD-10-CM | POA: Diagnosis not present

## 2021-05-07 DIAGNOSIS — F411 Generalized anxiety disorder: Secondary | ICD-10-CM | POA: Diagnosis not present

## 2021-05-08 DIAGNOSIS — F410 Panic disorder [episodic paroxysmal anxiety] without agoraphobia: Secondary | ICD-10-CM | POA: Diagnosis not present

## 2021-05-09 DIAGNOSIS — I471 Supraventricular tachycardia: Secondary | ICD-10-CM | POA: Diagnosis not present

## 2021-05-14 DIAGNOSIS — F410 Panic disorder [episodic paroxysmal anxiety] without agoraphobia: Secondary | ICD-10-CM | POA: Diagnosis not present

## 2021-05-16 DIAGNOSIS — F410 Panic disorder [episodic paroxysmal anxiety] without agoraphobia: Secondary | ICD-10-CM | POA: Diagnosis not present

## 2021-05-21 DIAGNOSIS — F410 Panic disorder [episodic paroxysmal anxiety] without agoraphobia: Secondary | ICD-10-CM | POA: Diagnosis not present

## 2021-05-29 DIAGNOSIS — F410 Panic disorder [episodic paroxysmal anxiety] without agoraphobia: Secondary | ICD-10-CM | POA: Diagnosis not present

## 2021-06-04 DIAGNOSIS — T781XXD Other adverse food reactions, not elsewhere classified, subsequent encounter: Secondary | ICD-10-CM | POA: Diagnosis not present

## 2021-06-04 DIAGNOSIS — J3081 Allergic rhinitis due to animal (cat) (dog) hair and dander: Secondary | ICD-10-CM | POA: Diagnosis not present

## 2021-06-04 DIAGNOSIS — F410 Panic disorder [episodic paroxysmal anxiety] without agoraphobia: Secondary | ICD-10-CM | POA: Diagnosis not present

## 2021-06-04 DIAGNOSIS — J301 Allergic rhinitis due to pollen: Secondary | ICD-10-CM | POA: Diagnosis not present

## 2021-06-04 DIAGNOSIS — J3089 Other allergic rhinitis: Secondary | ICD-10-CM | POA: Diagnosis not present

## 2021-07-04 DIAGNOSIS — F410 Panic disorder [episodic paroxysmal anxiety] without agoraphobia: Secondary | ICD-10-CM | POA: Diagnosis not present

## 2021-08-12 ENCOUNTER — Other Ambulatory Visit (INDEPENDENT_AMBULATORY_CARE_PROVIDER_SITE_OTHER): Payer: Federal, State, Local not specified - PPO

## 2021-08-12 ENCOUNTER — Ambulatory Visit: Payer: Federal, State, Local not specified - PPO | Admitting: Gastroenterology

## 2021-08-12 ENCOUNTER — Encounter: Payer: Self-pay | Admitting: Gastroenterology

## 2021-08-12 VITALS — BP 144/72 | HR 86 | Ht 63.0 in | Wt 188.0 lb

## 2021-08-12 DIAGNOSIS — R109 Unspecified abdominal pain: Secondary | ICD-10-CM | POA: Diagnosis not present

## 2021-08-12 DIAGNOSIS — R195 Other fecal abnormalities: Secondary | ICD-10-CM

## 2021-08-12 DIAGNOSIS — Z86010 Personal history of colonic polyps: Secondary | ICD-10-CM | POA: Diagnosis not present

## 2021-08-12 DIAGNOSIS — Z860101 Personal history of adenomatous and serrated colon polyps: Secondary | ICD-10-CM

## 2021-08-12 LAB — COMPREHENSIVE METABOLIC PANEL
ALT: 14 U/L (ref 0–35)
AST: 18 U/L (ref 0–37)
Albumin: 4.6 g/dL (ref 3.5–5.2)
Alkaline Phosphatase: 78 U/L (ref 39–117)
BUN: 21 mg/dL (ref 6–23)
CO2: 30 mEq/L (ref 19–32)
Calcium: 9.8 mg/dL (ref 8.4–10.5)
Chloride: 99 mEq/L (ref 96–112)
Creatinine, Ser: 0.85 mg/dL (ref 0.40–1.20)
GFR: 74.7 mL/min (ref >60.00)
Glucose, Bld: 95 mg/dL (ref 70–99)
Potassium: 4.3 mEq/L (ref 3.5–5.1)
Sodium: 138 mEq/L (ref 135–145)
Total Bilirubin: 0.3 mg/dL (ref 0.2–1.2)
Total Protein: 7.9 g/dL (ref 6.0–8.3)

## 2021-08-12 LAB — CBC WITH DIFFERENTIAL/PLATELET
Basophils Absolute: 0.1 10*3/uL (ref 0.0–0.1)
Basophils Relative: 0.7 % (ref 0.0–3.0)
Eosinophils Absolute: 0.1 10*3/uL (ref 0.0–0.7)
Eosinophils Relative: 1.3 % (ref 0.0–5.0)
HCT: 41.6 % (ref 36.0–46.0)
Hemoglobin: 13.7 g/dL (ref 12.0–15.0)
Lymphocytes Relative: 28 % (ref 12.0–46.0)
Lymphs Abs: 2.3 10*3/uL (ref 0.7–4.0)
MCHC: 32.9 g/dL (ref 30.0–36.0)
MCV: 84.3 fl (ref 78.0–100.0)
Monocytes Absolute: 0.5 10*3/uL (ref 0.1–1.0)
Monocytes Relative: 6.5 % (ref 3.0–12.0)
Neutro Abs: 5.2 10*3/uL (ref 1.4–7.7)
Neutrophils Relative %: 63.5 % (ref 43.0–77.0)
Platelets: 335 10*3/uL (ref 150.0–400.0)
RBC: 4.93 Mil/uL (ref 3.87–5.11)
RDW: 13.7 % (ref 11.5–15.5)
WBC: 8.2 10*3/uL (ref 4.0–10.5)

## 2021-08-12 LAB — MAGNESIUM: Magnesium: 1.9 mg/dL (ref 1.5–2.5)

## 2021-08-12 MED ORDER — PLENVU 140 G PO SOLR: 1.0000 | Freq: Once | ORAL | 0 refills | Status: AC

## 2021-08-12 NOTE — Progress Notes (Signed)
Assessment    Personal history of adenomatous colon polyps. Family history of colon polyps, first-degree relative Right-sided abdominal pain and diarrhea with gluten, R/O celiac disease Change in stools, likely mucus.  Very unlikely but R/O parasites History of SVT and PVCs GAD  Recommendations   CMP, CBC, Mg, tTG, IgA Schedule colonoscopy and EGD. The risks (including bleeding, perforation, infection, missed lesions, medication reactions and possible hospitalization or surgery if complications occur), benefits, and alternatives to colonoscopy with possible biopsy and possible polypectomy were discussed with the patient and they consent to proceed.  The risks (including bleeding, perforation, infection, missed lesions, medication reactions and possible hospitalization or surgery if complications occur), benefits, and alternatives to endoscopy with possible biopsy and possible dilation were discussed with the patient and they consent to proceed.     HPI   Chief complaint: Personal history of colon polyps, family history of colon polyps, right-sided abdominal pain, change in stools  Patient profile:  [name] Horn is a 61 y.o. female referred by Crist Infante, MD for personal history of adenomatous colon polyps and family history of colon polyps in a first-degree relative.  She complains of intermittent right-sided abdominal pain and diarrhea when she has gluten containing foods.  She generally avoids gluten.  She notes she seeing white areas in her stool that she feels might be worms. Denies weight loss, constipation, change in stool caliber, melena, hematochezia, nausea, vomiting, dysphagia, reflux symptoms, chest pain.    Previous Labs / Imaging::    Latest Ref Rng & Units 09/07/2018    4:32 PM 02/09/2018    2:58 PM 01/28/2018   12:33 AM  CBC  WBC 4.0 - 10.5 K/uL 10.2  9.4  12.8   Hemoglobin 12.0 - 15.0 g/dL 13.3  13.2  12.2   Hematocrit 36.0 - 46.0 % 40.4  40.5  39.3    Platelets 150.0 - 400.0 K/uL 404.0  376.0  319     Lab Results  Component Value Date   LIPASE 40.0 09/07/2018      Latest Ref Rng & Units 09/07/2018    4:32 PM 01/28/2018   12:33 AM 04/01/2011   12:07 PM  CMP  Glucose 70 - 99 mg/dL 91  122  101   BUN 6 - 23 mg/dL '13  10  14   '$ Creatinine 0.40 - 1.20 mg/dL 0.77  0.72  0.7   Sodium 135 - 145 mEq/L 137  139  137   Potassium 3.5 - 5.1 mEq/L 4.1  4.4  4.1   Chloride 96 - 112 mEq/L 98  103  98   CO2 19 - 32 mEq/L '31  26  30   '$ Calcium 8.4 - 10.5 mg/dL 9.6  9.0  9.3   Total Protein 6.0 - 8.3 g/dL 7.6  7.4  7.6   Total Bilirubin 0.2 - 1.2 mg/dL 0.5  0.9  0.4   Alkaline Phos 39 - 117 U/L 97  77  98   AST 0 - 37 U/L '16  19  22   '$ ALT 0 - 35 U/L '16  15  19      '$ Previous GI evaluation    Endoscopies:  Colonoscopy July 2018 - One 7 mm polyp in the ascending colon, removed with a cold snare. Resected and retrieved. - Mild diverticulosis in the left colon. There was no evidence of diverticular bleeding. - The examination was otherwise normal on direct and retroflexion views. Path: TA  Imaging:  Past Medical History:  Diagnosis Date   Allergic rhinitis    Anesthesia complication    Per pt, "sensitive" to sedation!   Anxiety    Arthritis    left thumb   Asthma    reactive (cats, cigarette smoke)   Claustrophobia    Diverticulosis    GERD (gastroesophageal reflux disease)    Hemorrhoids    IBS (irritable bowel syndrome)    got better from changing diet   Iron deficiency anemia    Lung nodule 01/2018   Obstructive sleep apnea 02/2005   Mild, positional   Palpitation 04/2021   benign   Post-operative nausea and vomiting    after ankle surgery 2006   PVC's (premature ventricular contractions) 06/2011   on Holter   Sleep apnea    no c-pap   Tubular adenoma of colon 05/2010   Past Surgical History:  Procedure Laterality Date   ANKLE SURGERY Right 2006   placement of screws and pins with subsequent removal (2010)    CESAREAN SECTION     x 2   EXCISION ORAL TUMOR N/A 03/24/2017   Procedure: MINOR SALIVARY GLAND BIOSPY;  Surgeon: Carloyn Manner, MD;  Location: [place];  Service: ENT;  Laterality: N/A;  LOCAL WITH IV SEDATION sleep apnea   NASAL SEPTUM SURGERY     TONSILLECTOMY     Family History  Problem Relation Age of Onset   Colon polyps Mother        adenomatous polyps in her 31's   Alzheimer's disease Mother    Diabetes Mother    Heart disease Mother    Pneumonia Father    Colitis Brother    Diverticulitis Brother    Crohn's disease Brother        half brother   Colon cancer Maternal Grandmother        in her 62's   Esophageal cancer Neg Hx    Rectal cancer Neg Hx    Stomach cancer Neg Hx    Social History   Tobacco Use   Smoking status: Never   Smokeless tobacco: Never  Vaping Use   Vaping Use: Never used  Substance Use Topics   Alcohol use: Yes    Alcohol/week: 2.0 standard drinks of alcohol    Types: 2 Cans of beer per week    Comment: occ   Drug use: No   Current Outpatient Medications  Medication Sig Dispense Refill   ascorbic acid (VITAMIN C) 500 MG tablet Take 1,000 mg by mouth daily.     augmented betamethasone dipropionate (DIPROLENE-AF) 0.05 % cream as needed.     Cholecalciferol (D3-1000 PO) Take by mouth daily.     Coenzyme Q10 10 MG capsule Take 10 mg by mouth daily.     EPINEPHrine 0.3 mg/0.3 mL IJ SOAJ injection Inject into the muscle as directed.     ibuprofen (ADVIL,MOTRIN) 200 MG tablet Take 200 mg by mouth every 6 (six) hours as needed for moderate pain.      levocetirizine (XYZAL) 5 MG tablet 5 mg as needed.     Multiple Vitamin (MULTIVITAMIN PO) Take 1 tablet by mouth daily.      vitamin B-12 (CYANOCOBALAMIN) 500 MCG tablet Take 500 mcg by mouth daily.     No current facility-administered medications for this visit.   Allergies  Allergen Reactions   Aspirin     Wheezing,SOB   Ciprofloxacin Rash   Sulfa Antibiotics Rash     Review of Systems: All other systems  reviewed and negative except where noted in HPI.    Physical Exam    Wt Readings from Last 3 Encounters:  08/12/21 188 lb (85.3 kg)  10/05/18 190 lb (86.2 kg)  02/09/18 188 lb 8 oz (85.5 kg)    Ht '5\' 3"'$  (1.6 m)   Wt 188 lb (85.3 kg)   LMP 02/07/2011   BMI 33.30 kg/m  Constitutional:  Generally well appearing female in no acute distress. Psychiatric: Pleasant. Normal mood and affect. Behavior is normal. HEENT: Pupils normal.  Conjunctivae are normal. No scleral icterus. Neck supple.  Cardiovascular: Normal rate, regular rhythm. No edema Pulmonary/chest: Effort normal and breath sounds normal. No wheezing, rales or rhonchi. Abdominal: Soft, nondistended, nontender. Bowel sounds active throughout. There are no masses palpable. No hepatomegaly. Rectal: Deferred to colonoscopy Neurological: Alert and oriented to person place and time. Anxious Skin: Skin is warm and dry. No rashes noted.  Lucio Edward, MD   cc:  Referring Provider Crist Infante, MD

## 2021-08-12 NOTE — Patient Instructions (Signed)
Your provider has requested that you go to the basement level for lab work before leaving today. Press "B" on the elevator. The lab is located at the first door on the left as you exit the elevator.  You have been scheduled for an endoscopy and colonoscopy. Please follow the written instructions given to you at your visit today. Please pick up your prep supplies at the pharmacy within the next 1-3 days. If you use inhalers (even only as needed), please bring them with you on the day of your procedure.  The  GI providers would like to encourage you to use [facility] to communicate with providers for non-urgent requests or questions.  Due to long hold times on the telephone, sending your provider a message by [facility] may be a faster and more efficient way to get a response.  Please allow 48 business hours for a response.  Please remember that this is for non-urgent requests.   Due to recent changes in healthcare laws, you may see the results of your imaging and laboratory studies on MyChart before your provider has had a chance to review them.  We understand that in some cases there may be results that are confusing or concerning to you. Not all laboratory results come back in the same time frame and the provider may be waiting for multiple results in order to interpret others.  Please give [place] 48 hours in order for your provider to thoroughly review all the results before contacting the office for clarification of your results.   Thank you for choosing me and [facility].  Pricilla Riffle. Dagoberto Ligas., MD., Marval Regal

## 2021-08-13 LAB — TISSUE TRANSGLUTAMINASE, IGA: (tTG) Ab, IgA: <1 U/mL

## 2021-08-13 LAB — IGA: Immunoglobulin A: 288 mg/dL (ref 47–310)

## 2021-08-15 ENCOUNTER — Telehealth: Payer: Self-pay | Admitting: Gastroenterology

## 2021-08-15 NOTE — Telephone Encounter (Signed)
PT is returning call from [place]. Please reach out to advise. Thank you.

## 2021-08-15 NOTE — Telephone Encounter (Signed)
See result note.  

## 2021-08-21 DIAGNOSIS — F410 Panic disorder [episodic paroxysmal anxiety] without agoraphobia: Secondary | ICD-10-CM | POA: Diagnosis not present

## 2021-08-25 ENCOUNTER — Telehealth: Payer: Self-pay | Admitting: Gastroenterology

## 2021-08-25 NOTE — Telephone Encounter (Signed)
Inbound call from patient stating that her pharmacy stated that PLENVU was going to be 90 dollars. Patient is requesting a call to see if she can do an alternative prep. Please advise.

## 2021-08-26 NOTE — Telephone Encounter (Signed)
Left message for patient to return my call.

## 2021-08-26 NOTE — Telephone Encounter (Signed)
Informed patient that I have a sample prep kit of Plenvu I can leave at our front desk if she can pick it up this week. Patient thanked me and will come and pick up prep this week. 

## 2021-09-05 DIAGNOSIS — R399 Unspecified symptoms and signs involving the genitourinary system: Secondary | ICD-10-CM | POA: Diagnosis not present

## 2021-09-25 NOTE — Telephone Encounter (Signed)
Patient called to find out if her prep is at the front desk for her to pick up

## 2021-09-26 ENCOUNTER — Other Ambulatory Visit: Payer: Federal, State, Local not specified - PPO

## 2021-09-26 DIAGNOSIS — R109 Unspecified abdominal pain: Secondary | ICD-10-CM | POA: Diagnosis not present

## 2021-09-26 DIAGNOSIS — R195 Other fecal abnormalities: Secondary | ICD-10-CM | POA: Diagnosis not present

## 2021-09-26 DIAGNOSIS — Z86010 Personal history of colonic polyps: Secondary | ICD-10-CM | POA: Diagnosis not present

## 2021-09-26 NOTE — Telephone Encounter (Signed)
Informed patient the prep kit is still at the front desk for her to pick up.

## 2021-09-29 ENCOUNTER — Encounter: Payer: Self-pay | Admitting: Gastroenterology

## 2021-10-06 ENCOUNTER — Encounter: Payer: Self-pay | Admitting: Gastroenterology

## 2021-10-06 ENCOUNTER — Ambulatory Visit (AMBULATORY_SURGERY_CENTER): Payer: Federal, State, Local not specified - PPO | Admitting: Gastroenterology

## 2021-10-06 VITALS — BP 128/68 | HR 68 | Temp 98.7°F | Resp 13 | Ht 63.0 in | Wt 188.0 lb

## 2021-10-06 DIAGNOSIS — K2980 Duodenitis without bleeding: Secondary | ICD-10-CM

## 2021-10-06 DIAGNOSIS — Z86010 Personal history of colonic polyps: Secondary | ICD-10-CM

## 2021-10-06 DIAGNOSIS — Z09 Encounter for follow-up examination after completed treatment for conditions other than malignant neoplasm: Secondary | ICD-10-CM

## 2021-10-06 DIAGNOSIS — Z8371 Family history of colonic polyps: Secondary | ICD-10-CM

## 2021-10-06 DIAGNOSIS — R109 Unspecified abdominal pain: Secondary | ICD-10-CM

## 2021-10-06 DIAGNOSIS — K3189 Other diseases of stomach and duodenum: Secondary | ICD-10-CM | POA: Diagnosis not present

## 2021-10-06 LAB — OVA AND PARASITE EXAMINATION
CONCENTRATE RESULT:: NONE SEEN
MICRO NUMBER:: 13923851
SPECIMEN QUALITY:: ADEQUATE
TRICHROME RESULT:: NONE SEEN

## 2021-10-06 MED ORDER — SODIUM CHLORIDE 0.9 % IV SOLN: 500.0000 mL | Freq: Once | INTRAVENOUS | Status: DC

## 2021-10-06 NOTE — Progress Notes (Signed)
Pt's states no medical or surgical changes since previsit or office visit. 

## 2021-10-06 NOTE — Op Note (Signed)
[place] Patient Name: [name] Horn Procedure Date: 10/06/2021 2:42 PM MRN: [id number] Endoscopist: Ladene Artist , MD Age: 61 Referring MD:  Date of Birth: [birth year] Gender: Female Account #: [id number] Procedure:                Upper GI endoscopy Indications:              Right-sided abdominal pain Medicines:                Monitored Anesthesia Care Procedure:                Pre-Anesthesia Assessment:                           - Prior to the procedure, a History and Physical                            was performed, and patient medications and                            allergies were reviewed. The patient's tolerance of                            previous anesthesia was also reviewed. The risks                            and benefits of the procedure and the sedation                            options and risks were discussed with the patient.                            All questions were answered, and informed consent                            was obtained. Prior Anticoagulants: The patient has                            taken no previous anticoagulant or antiplatelet                            agents. ASA Grade Assessment: II - A patient with                            mild systemic disease. After reviewing the risks                            and benefits, the patient was deemed in                            satisfactory condition to undergo the procedure.                           After obtaining informed consent, the endoscope was  passed under direct vision. Throughout the                            procedure, the patient's blood pressure, pulse, and                            oxygen saturations were monitored continuously. The                            GIF [id number] #[id number] was introduced through the                            mouth, and advanced to the second part of duodenum.                            The upper GI endoscopy was  accomplished without                            difficulty. The patient tolerated the procedure                            well. Scope In: Scope Out: Findings:                 The examined esophagus was normal.                           The entire examined stomach was normal.                           The duodenal bulb and second portion of the                            duodenum were normal. Biopsies for histology were                            taken with a cold forceps for evaluation of celiac                            disease. Complications:            No immediate complications. Estimated Blood Loss:     Estimated blood loss was minimal. Impression:               - Normal esophagus.                           - Normal stomach.                           - Normal duodenal bulb and second portion of the                            duodenum. Biopsied. Recommendation:           - Patient has a contact number available for  emergencies. The signs and symptoms of potential                            delayed complications were discussed with the                            patient. Return to normal activities tomorrow.                            Written discharge instructions were provided to the                            patient.                           - Resume previous diet.                           - Continue present medications.                           - Await pathology results. Ladene Artist, MD 10/06/2021 3:17:14 PM This report has been signed electronically.

## 2021-10-06 NOTE — Progress Notes (Signed)
A and O x3. Report to RN. Tolerated MAC anesthesia well.Teeth unchanged after procedure. 

## 2021-10-06 NOTE — Op Note (Signed)
[place] Patient Name: [name] Horn Procedure Date: 10/06/2021 2:43 PM MRN: [id number] Endoscopist: Ladene Artist , MD Age: 61 Referring MD:  Date of Birth: 1960/11/09 Gender: Female Account #: [id number] Procedure:                Colonoscopy Indications:              Surveillance: Personal history of adenomatous                            polyps on last colonoscopy 5 years ago, Family                            history of colon polyps, 1st-degree relative Medicines:                Monitored Anesthesia Care Procedure:                Pre-Anesthesia Assessment:                           - Prior to the procedure, a History and Physical                            was performed, and patient medications and                            allergies were reviewed. The patient's tolerance of                            previous anesthesia was also reviewed. The risks                            and benefits of the procedure and the sedation                            options and risks were discussed with the patient.                            All questions were answered, and informed consent                            was obtained. Prior Anticoagulants: The patient has                            taken no previous anticoagulant or antiplatelet                            agents. ASA Grade Assessment: II - A patient with                            mild systemic disease. After reviewing the risks                            and benefits, the patient was deemed in  satisfactory condition to undergo the procedure.                           After obtaining informed consent, the colonoscope                            was passed under direct vision. Throughout the                            procedure, the patient's blood pressure, pulse, and                            oxygen saturations were monitored continuously. The                            CF HQ190L #[id number]  was introduced through the anus                            and advanced to the the cecum, identified by                            appendiceal orifice and ileocecal valve. The                            ileocecal valve, appendiceal orifice, and rectum                            were photographed. The quality of the bowel                            preparation was excellent. The colonoscopy was                            performed without difficulty. The patient tolerated                            the procedure well. Scope In: 2:48:39 PM Scope Out: 3:02:36 PM Scope Withdrawal Time: 0 hours 11 minutes 5 seconds  Total Procedure Duration: 0 hours 13 minutes 57 seconds  Findings:                 The perianal and digital rectal examinations were                            normal.                           A few small-mouthed diverticula were found in the                            left colon. There was no evidence of diverticular                            bleeding.  The exam was otherwise without abnormality on                            direct and retroflexion views. Complications:            No immediate complications. Estimated blood loss:                            None. Estimated Blood Loss:     Estimated blood loss: none. Impression:               - Mild diverticulosis in the left colon.                           - The examination was otherwise normal on direct                            and retroflexion views.                           - No specimens collected. Recommendation:           - Repeat colonoscopy in 5 years for surveillance.                           - Patient has a contact number available for                            emergencies. The signs and symptoms of potential                            delayed complications were discussed with the                            patient. Return to normal activities tomorrow.                             Written discharge instructions were provided to the                            patient.                           - High fiber diet.                           - Continue present medications. Ladene Artist, MD 10/06/2021 3:06:09 PM This report has been signed electronically.

## 2021-10-06 NOTE — Patient Instructions (Addendum)
Handout provided on diverticulosis.   Recommend a high-fiber diet (see handout).   Repeat colonoscopy in 5 years for screening purposes.   Await pathology results. Continue present medications.   YOU HAD AN ENDOSCOPIC PROCEDURE TODAY AT [facility]:   Refer to the procedure report that was given to you for any specific questions about what was found during the examination.  If the procedure report does not answer your questions, please call your gastroenterologist to clarify.  If you requested that your care partner not be given the details of your procedure findings, then the procedure report has been included in a sealed envelope for you to review at your convenience later.  YOU SHOULD EXPECT: Some feelings of bloating in the abdomen. Passage of more gas than usual.  Walking can help get rid of the air that was put into your GI tract during the procedure and reduce the bloating. If you had a lower endoscopy (such as a colonoscopy or flexible sigmoidoscopy) you may notice spotting of blood in your stool or on the toilet paper. If you underwent a bowel prep for your procedure, you may not have a normal bowel movement for a few days.  Please Note:  You might notice some irritation and congestion in your nose or some drainage.  This is from the oxygen used during your procedure.  There is no need for concern and it should clear up in a day or so.  SYMPTOMS TO REPORT IMMEDIATELY:  Following lower endoscopy (colonoscopy or flexible sigmoidoscopy):  Excessive amounts of blood in the stool  Significant tenderness or worsening of abdominal pains  Swelling of the abdomen that is new, acute  Fever of 100F or higher  Following upper endoscopy (EGD)  Vomiting of blood or coffee ground material  New chest pain or pain under the shoulder blades  Painful or persistently difficult swallowing  New shortness of breath  Fever of 100F or higher  Black, tarry-looking stools  For urgent  or emergent issues, a gastroenterologist can be reached at any hour by calling [phone]. Do not use MyChart messaging for urgent concerns.    DIET:  We do recommend a small meal at first, but then you may proceed to your regular diet.  Drink plenty of fluids but you should avoid alcoholic beverages for 24 hours.  ACTIVITY:  You should plan to take it easy for the rest of today and you should NOT DRIVE or use heavy machinery until tomorrow (because of the sedation medicines used during the test).    FOLLOW UP: Our staff will call the number listed on your records the next business day following your procedure.  We will call around 7:15- 8:00 am to check on you and address any questions or concerns that you may have regarding the information given to you following your procedure. If we do not reach you, we will leave a message.     If any biopsies were taken you will be contacted by phone or by letter within the next 1-3 weeks.  Please call us at [phone] if you have not heard about the biopsies in 3 weeks.    SIGNATURES/CONFIDENTIALITY: You and/or your care partner have signed paperwork which will be entered into your electronic medical record.  These signatures attest to the fact that that the information above on your After Visit Summary has been reviewed and is understood.  Full responsibility of the confidentiality of this discharge information lies with you and/or your  care-partner.  

## 2021-10-06 NOTE — Progress Notes (Signed)
Called to room to assist during endoscopic procedure.  Patient ID and intended procedure confirmed with present staff. Received instructions for my participation in the procedure from the performing physician.  

## 2021-10-06 NOTE — Progress Notes (Signed)
History & Physical  Primary Care Physician:  Crist Infante, MD Primary Gastroenterologist: Lucio Edward, MD  CHIEF COMPLAINT:  Personal history of colon polyps, Family history of colon polyps, right-sided abdominal pain  HPI: [name] Horn is a 61 y.o. female here for colonoscopy.    Past Medical History:  Diagnosis Date   Allergic rhinitis    Anesthesia complication    Per pt, "sensitive" to sedation!   Anxiety    Arthritis    left thumb   Asthma    reactive (cats, cigarette smoke)   Claustrophobia    Diverticulosis    GERD (gastroesophageal reflux disease)    Hemorrhoids    IBS (irritable bowel syndrome)    got better from changing diet   Iron deficiency anemia    Lung nodule 01/2018   Obstructive sleep apnea 02/2005   Mild, positional   Palpitation 04/2021   benign   Post-operative nausea and vomiting    after ankle surgery 2006   PVC's (premature ventricular contractions) 06/2011   on Holter   Sleep apnea    no c-pap   Tubular adenoma of colon 05/2010    Past Surgical History:  Procedure Laterality Date   ANKLE SURGERY Right 2006   placement of screws and pins with subsequent removal (2010)   CESAREAN SECTION     x 2   EXCISION ORAL TUMOR N/A 03/24/2017   Procedure: MINOR SALIVARY GLAND BIOSPY;  Surgeon: Carloyn Manner, MD;  Location: [facility];  Service: ENT;  Laterality: N/A;  LOCAL WITH IV SEDATION sleep apnea   NASAL SEPTUM SURGERY     TONSILLECTOMY      Prior to Admission medications   Medication Sig Start Date End Date Taking? Authorizing Provider  ascorbic acid (VITAMIN C) 500 MG tablet Take 1,000 mg by mouth daily.   Yes [provider]  Cholecalciferol (D3-1000 PO) Take by mouth daily.   Yes [provider]  Coenzyme Q10 10 MG capsule Take 10 mg by mouth daily.   Yes [provider]  molnupiravir EUA (LAGEVRIO) 200 MG CAPS capsule 4 capsules Orally every 12 hrs for 5 days 09/18/21  Yes [provider]  Multiple Vitamin (MULTIVITAMIN PO) Take 1 tablet by mouth daily.    Yes [provider]  vitamin B-12 (CYANOCOBALAMIN) 500 MCG tablet Take 500 mcg by mouth daily.   Yes [provider]  augmented betamethasone dipropionate (DIPROLENE-AF) 0.05 % cream as needed.    [provider]  EPINEPHrine 0.3 mg/0.3 mL IJ SOAJ injection Inject into the muscle as directed. 06/16/21   [provider]  ibuprofen (ADVIL,MOTRIN) 200 MG tablet Take 200 mg by mouth every 6 (six) hours as needed for moderate pain.     [provider]  levocetirizine (XYZAL) 5 MG tablet 5 mg as needed.    [provider]    Current Outpatient Medications  Medication Sig Dispense Refill   ascorbic acid (VITAMIN C) 500 MG tablet Take 1,000 mg by mouth daily.     Cholecalciferol (D3-1000 PO) Take by mouth daily.     Coenzyme Q10 10 MG capsule Take 10 mg by mouth daily.     molnupiravir EUA (LAGEVRIO) 200 MG CAPS capsule 4 capsules Orally every 12 hrs for 5 days     Multiple Vitamin (MULTIVITAMIN PO) Take 1 tablet by mouth daily.      vitamin B-12 (CYANOCOBALAMIN) 500 MCG tablet Take 500 mcg by mouth daily.     augmented  betamethasone dipropionate (DIPROLENE-AF) 0.05 % cream as needed.     EPINEPHrine 0.3 mg/0.3 mL IJ SOAJ injection Inject into the muscle as directed.     ibuprofen (ADVIL,MOTRIN) 200 MG tablet Take 200 mg by mouth every 6 (six) hours as needed for moderate pain.      levocetirizine (XYZAL) 5 MG tablet 5 mg as needed.     Current Facility-Administered Medications  Medication Dose Route Frequency Provider Last Rate Last Admin   0.9 %  sodium chloride infusion  500 mL Intravenous Once Ladene Artist, MD        Allergies as of 10/06/2021 - Review Complete 10/06/2021  Allergen Reaction Noted   Aspirin     Ciprofloxacin Rash 03/18/2017   Sulfa antibiotics Rash 02/15/2012    Family History  Problem Relation Age of Onset   Colon polyps Mother         adenomatous polyps in her 71's   Alzheimer's disease Mother    Diabetes Mother    Heart disease Mother    Pneumonia Father    Colitis Brother    Diverticulitis Brother    Crohn's disease Brother        half brother   Colon cancer Maternal Grandmother        in her 82's   Esophageal cancer Neg Hx    Rectal cancer Neg Hx    Stomach cancer Neg Hx     Social History   Socioeconomic History   Marital status: Married    Spouse name: Not on file   Number of children: Not on file   Years of education: Not on file   Highest education level: Not on file  Occupational History   Occupation: Educational psychologist  Tobacco Use   Smoking status: Never   Smokeless tobacco: Never  Vaping Use   Vaping Use: Never used  Substance and Sexual Activity   Alcohol use: Yes    Alcohol/week: 2.0 standard drinks of alcohol    Types: 2 Cans of beer per week    Comment: occ   Drug use: No   Sexual activity: Not on file  Other Topics Concern   Not on file  Social History Narrative   Not on file   Social Determinants of Health   Financial Resource Strain: Not on file  Food Insecurity: Not on file  Transportation Needs: Not on file  Physical Activity: Not on file  Stress: Not on file  Social Connections: Not on file  Intimate Partner Violence: Not on file    Review of Systems:  All systems reviewed were negative except where noted in HPI.   Physical Exam: General:  Alert, well-developed, in NAD Head:  Normocephalic and atraumatic. Eyes:  Sclera clear, no icterus.   Conjunctiva pink. Ears:  Normal auditory acuity. Mouth:  No deformity or lesions.  Neck:  Supple; no masses . Lungs:  Clear throughout to auscultation.   No wheezes, crackles, or rhonchi. No acute distress. Heart:  Regular rate and rhythm; no murmurs. Abdomen:  Soft, nondistended, nontender. No masses, hepatomegaly. No obvious masses.  Normal bowel .    Rectal:  Deferred   Msk:  Symmetrical without gross  deformities.. Pulses:  Normal pulses noted. Extremities:  Without edema. Neurologic:  Alert and  oriented x4;  grossly normal neurologically. Skin:  Intact without significant lesions or rashes. Cervical Nodes:  No significant cervical adenopathy. Psych:  Alert and cooperative. Normal mood and affect.   Impression / Plan:   Personal history  of adenomatous colon polyps, family history of colon polyps and right-sided abdominal pain for colonoscopy and EGD.   Pricilla Riffle. Fuller Plan  10/06/2021, 2:17 PM See Shea Evans, [facility], to contact our on call provider

## 2021-10-07 ENCOUNTER — Telehealth: Payer: Self-pay | Admitting: *Deleted

## 2021-10-07 DIAGNOSIS — Z01419 Encounter for gynecological examination (general) (routine) without abnormal findings: Secondary | ICD-10-CM | POA: Diagnosis not present

## 2021-10-07 DIAGNOSIS — Z1389 Encounter for screening for other disorder: Secondary | ICD-10-CM | POA: Diagnosis not present

## 2021-10-07 DIAGNOSIS — Z130 Encounter for screening for diseases of the blood and blood-forming organs and certain disorders involving the immune mechanism: Secondary | ICD-10-CM | POA: Diagnosis not present

## 2021-10-07 DIAGNOSIS — Z6833 Body mass index (BMI) 33.0-33.9, adult: Secondary | ICD-10-CM | POA: Diagnosis not present

## 2021-10-07 NOTE — Telephone Encounter (Signed)
  Follow up Call-     10/06/2021    1:51 PM  Call back number  Post procedure Call Back phone  # [phone]  Permission to leave phone message Yes     Patient questions:  Do you have a fever, pain , or abdominal swelling? No. Pain Score  0 *  Have you tolerated food without any problems? Yes.    Have you been able to return to your normal activities? Yes.    Do you have any questions about your discharge instructions: Diet   No. Medications  No. Follow up visit  No.  Do you have questions or concerns about your Care? No.  Actions: * If pain score is 4 or above: No action needed, pain <4.

## 2021-10-08 DIAGNOSIS — F410 Panic disorder [episodic paroxysmal anxiety] without agoraphobia: Secondary | ICD-10-CM | POA: Diagnosis not present

## 2021-10-14 ENCOUNTER — Telehealth: Payer: Self-pay | Admitting: Gastroenterology

## 2021-10-14 NOTE — Telephone Encounter (Signed)
Spoke with patient & advised her that once Dr. Fuller Plan has reviewed pathology results, then she will receive a letter in mychart with the results and his recommendations. Pt verbalized all understanding.

## 2021-10-14 NOTE — Telephone Encounter (Signed)
Inbound call from patient wondering if the results from her celiac disease came back. Please advise.

## 2021-10-22 ENCOUNTER — Encounter: Payer: Self-pay | Admitting: Gastroenterology

## 2021-10-22 DIAGNOSIS — F410 Panic disorder [episodic paroxysmal anxiety] without agoraphobia: Secondary | ICD-10-CM | POA: Diagnosis not present

## 2021-10-24 NOTE — Telephone Encounter (Signed)
Spoke with patient regarding MD recommendations. Patient is requesting further information. Follow up scheduled for 12/17/21 at 9:10 am with Dr. Fuller Plan. Sooner appointment to be seen with NP/PA has been offered, but patient declined.

## 2021-10-24 NOTE — Telephone Encounter (Signed)
Patient requesting results again, I advised her that once Dr. Fuller Plan has view them they would be available to her.

## 2021-10-24 NOTE — Telephone Encounter (Signed)
Spoke with patient regarding letter that was sent out yesterday. Advised she call back if symptoms persist.

## 2021-10-24 NOTE — Telephone Encounter (Signed)
The pathology findings in the duodenum for celiac disease are different and distinct from duodenitis. Her celiac antibodies were also negative. She could have a dietary intolerance to gluten, without a true allergy, which is much more common than celiac disease. Corn and oats are not the cause of duodenitis. I do not feel she needs a genetics evaluation.

## 2021-10-24 NOTE — Telephone Encounter (Signed)
Patient called back in with further questions. She researched duodenitis in further detail & believes that she could still have celiac disease despite the path results. She would like to know if she can have a genetics referral to be worked up for celiac, and also whether or not Dr. Fuller Plan thinks that oats and corn could be contributing to her duodenitis. Will route to MD.

## 2021-10-24 NOTE — Telephone Encounter (Signed)
Inbound call from patient requesting to speak with a nurse , she said she have some question regarding her test results .Please advise

## 2021-10-28 ENCOUNTER — Telehealth: Payer: Self-pay | Admitting: Gastroenterology

## 2021-10-28 DIAGNOSIS — D225 Melanocytic nevi of trunk: Secondary | ICD-10-CM | POA: Diagnosis not present

## 2021-10-28 DIAGNOSIS — L821 Other seborrheic keratosis: Secondary | ICD-10-CM | POA: Diagnosis not present

## 2021-10-28 DIAGNOSIS — D235 Other benign neoplasm of skin of trunk: Secondary | ICD-10-CM | POA: Diagnosis not present

## 2021-10-28 DIAGNOSIS — D2262 Melanocytic nevi of left upper limb, including shoulder: Secondary | ICD-10-CM | POA: Diagnosis not present

## 2021-10-28 NOTE — Telephone Encounter (Signed)
SIBO kit placed at 2nd floor front desk & patient will come by to pick up at earliest convenience.

## 2021-10-28 NOTE — Telephone Encounter (Signed)
Patient called in to follow up in regards to our phone conversation on 10/24/21. She has been doing better with avoiding gluten which has helped relieved the pain & diarrhea, however still experiencing bloating and gas post meals. She is very anxious & believes that with the biopsy results given to her that it could lead to stomach cancer. [name] requesting further testing be done including a SIBO test. She has been offered a sooner appointment to be seen with an APP, but prefers to only see Dr. Fuller Plan. Currently scheduled for 12/17/21.

## 2021-10-28 NOTE — Telephone Encounter (Signed)
Inbound call from patient requesting a call back to discuss if there can be any other test ran for her. Please advise.

## 2021-10-28 NOTE — Telephone Encounter (Signed)
Patient returned your call, please advise. 

## 2021-10-28 NOTE — Telephone Encounter (Signed)
Proceed with SIBO testing

## 2021-10-28 NOTE — Telephone Encounter (Signed)
Left message for patient to call back  

## 2021-11-06 ENCOUNTER — Telehealth: Payer: Self-pay

## 2021-11-06 NOTE — Telephone Encounter (Signed)
[facility] called to confirm ordered. Information provided, no further questions.

## 2021-12-12 DIAGNOSIS — M1812 Unilateral primary osteoarthritis of first carpometacarpal joint, left hand: Secondary | ICD-10-CM | POA: Diagnosis not present

## 2021-12-17 ENCOUNTER — Ambulatory Visit: Payer: Federal, State, Local not specified - PPO | Admitting: Gastroenterology

## 2021-12-17 ENCOUNTER — Encounter: Payer: Self-pay | Admitting: Gastroenterology

## 2021-12-17 VITALS — BP 120/78 | HR 90 | Ht 63.0 in | Wt 190.0 lb

## 2021-12-17 DIAGNOSIS — R140 Abdominal distension (gaseous): Secondary | ICD-10-CM

## 2021-12-17 DIAGNOSIS — K9041 Non-celiac gluten sensitivity: Secondary | ICD-10-CM

## 2021-12-17 NOTE — Patient Instructions (Signed)
Continue with SIBO testing.   The [facility] providers would like to encourage you to use [facility] to communicate with providers for non-urgent requests or questions.  Due to long hold times on the telephone, sending your provider a message by [facility] may be a faster and more efficient way to get a response.  Please allow 48 business hours for a response.  Please remember that this is for non-urgent requests.   Thank you for choosing me and [facility].  Pricilla Riffle. Dagoberto Ligas., MD., Marval Regal

## 2021-12-17 NOTE — Progress Notes (Signed)
    Assessment     Postprandial abdominal bloating Gluten intolerance Mild peptic duodenitis, asymptomatic Small umbilical hernia   Recommendations    Complete SIBO testing Continue gluten-free diet as it clearly reduces her abdominal symptoms Reassurance provided   HPI    This is a 61 year old female returning for postprandial abdominal bloating.  She has not yet completed SIBO testing.  She was concerned about her biopsy results showing peptic duodenitis.  We had previously reviewed the findings.  We reviewed this benign, nonprecancerous finding.  She states she was taking gluten for 3 weeks prior to her EGD.  Prior to that she was gluten-free and following her endoscopy she has remained gluten-free as this was contributing to her abdominal symptoms.  She notes that corn joint symptoms.   Labs / Imaging       Latest Ref Rng & Units 08/12/2021    3:33 PM 09/07/2018    4:32 PM 01/28/2018   12:33 AM  Hepatic Function  Total Protein 6.0 - 8.3 g/dL 7.9  7.6  7.4   Albumin 3.5 - 5.2 g/dL 4.6  4.3  4.1   AST 0 - 37 U/L _0 ALT 0 - 35 U/L _1 Alk Phosphatase 39 - 117 U/L 78  97  77   Total Bilirubin 0.2 - 1.2 mg/dL 0.3  0.5  0.9        Latest Ref Rng & Units 08/12/2021    3:33 PM 09/07/2018    4:32 PM 02/09/2018    2:58 PM  CBC  WBC 4.0 - 10.5 K/uL 8.2  10.2  9.4   Hemoglobin 12.0 - 15.0 g/dL 13.7  13.3  13.2   Hematocrit 36.0 - 46.0 % 41.6  40.4  40.5   Platelets 150.0 - 400.0 K/uL 335.0  404.0  376.0     Current Medications, Allergies, Past Medical History, Past Surgical History, Family History and Social History were reviewed in [employer] record.   Physical Exam: General: Well developed, well nourished, no acute distress Head: Normocephalic and atraumatic Eyes: Sclerae anicteric, EOMI Ears: Normal auditory acuity Mouth: No deformities or lesions noted Lungs: Clear throughout to auscultation Heart: Regular rate and rhythm; No  murmurs, rubs or bruits Abdomen: Soft, non tender and non distended. No masses, hepatosplenomegaly noted.  Small umbilical hernia.  Normal Bowel sounds Rectal: Not done Musculoskeletal: Symmetrical with no gross deformities  Pulses:  Normal pulses noted Extremities: No edema or deformities noted Neurological: Alert oriented x 4, grossly nonfocal Psychological:  Alert and cooperative. Normal mood and affect   Sahasra Belue T. Fuller Plan, MD 12/17/2021, 9:43 AM

## 2021-12-23 DIAGNOSIS — N952 Postmenopausal atrophic vaginitis: Secondary | ICD-10-CM | POA: Diagnosis not present

## 2022-01-20 DIAGNOSIS — F410 Panic disorder [episodic paroxysmal anxiety] without agoraphobia: Secondary | ICD-10-CM | POA: Diagnosis not present

## 2022-01-30 DIAGNOSIS — F410 Panic disorder [episodic paroxysmal anxiety] without agoraphobia: Secondary | ICD-10-CM | POA: Diagnosis not present

## 2022-02-10 DIAGNOSIS — F410 Panic disorder [episodic paroxysmal anxiety] without agoraphobia: Secondary | ICD-10-CM | POA: Diagnosis not present

## 2022-02-20 ENCOUNTER — Other Ambulatory Visit: Payer: Self-pay

## 2022-02-20 ENCOUNTER — Telehealth: Payer: Self-pay | Admitting: Gastroenterology

## 2022-02-20 MED ORDER — AMOXICILLIN-POT CLAVULANATE 875-125 MG PO TABS: 1.0000 | ORAL_TABLET | Freq: Two times a day (BID) | ORAL | 0 refills | Status: AC

## 2022-02-20 NOTE — Telephone Encounter (Signed)
Inbound call from pt, she want to speak with a nurse to see if they can prescribe something for her diverticulitis.Please advise

## 2022-02-20 NOTE — Telephone Encounter (Signed)
Per V.O. from Dr Silverio Decamp the pt has been prescribed Augmentin 875 mg BID for 10 days. She (the pt) has been advised to call the office next week with an update.  Prescription has been sent.

## 2022-02-20 NOTE — Telephone Encounter (Signed)
H/o sigmoid diverticulitis with recurrent symptoms concerns for acute diverticulitis, will empirically treat with PO Augmentin 861m BID PO X 10 days.

## 2022-02-20 NOTE — Telephone Encounter (Signed)
The pt began to have lower left abd pain that worsens with movement and pressure since Wednesday of this week.  She has a history of diverticulitis and says this feels the same as it did in the past. She complains of bloating and "puffy" stomach.  No bleeding, no fever.  She has waves of abd pain and pressure that comes and goes.  She had recent colon September of 2023 with Dr Fuller Plan.  Dr Silverio Decamp can you please review as DOD?

## 2022-02-24 DIAGNOSIS — F410 Panic disorder [episodic paroxysmal anxiety] without agoraphobia: Secondary | ICD-10-CM | POA: Diagnosis not present

## 2022-02-27 DIAGNOSIS — H2511 Age-related nuclear cataract, right eye: Secondary | ICD-10-CM | POA: Diagnosis not present

## 2022-02-27 DIAGNOSIS — Z0100 Encounter for examination of eyes and vision without abnormal findings: Secondary | ICD-10-CM | POA: Diagnosis not present

## 2022-02-27 DIAGNOSIS — D3132 Benign neoplasm of left choroid: Secondary | ICD-10-CM | POA: Diagnosis not present

## 2022-02-27 DIAGNOSIS — H2512 Age-related nuclear cataract, left eye: Secondary | ICD-10-CM | POA: Diagnosis not present

## 2022-03-30 ENCOUNTER — Telehealth: Payer: Self-pay | Admitting: Gastroenterology

## 2022-03-30 NOTE — Telephone Encounter (Signed)
[facility] called to get an updated order form for breath test kit. Please advise.

## 2022-03-30 NOTE — Telephone Encounter (Signed)
[facility] form for SIBO test has been re- faxed to [place] at [phone].

## 2022-04-06 NOTE — Telephone Encounter (Signed)
Patient called regarding SIBO test results. Please advise.

## 2022-04-06 NOTE — Telephone Encounter (Addendum)
Informed patient her results were faxed to our office this morning. I placed them on Dr. Lynne Leader desk for review. Informed patient Dr. Fuller Plan is off all this week. I offered to give results to the DOD to review for patient to have the results sooner. Patient declined and states she can wait on Dr. Fuller Plan to review. Asked patient if she was currently having any symptoms. Patient states she is not. Patient states she was wondering if her duodenitis that was on her pathology was caused by her eating gluten for the two weeks leading up to her EGD. Patient states she googled and it said that it can be the cause. She was wondering how she can prevent or improve her duodenitis.  Please advise Dr. Fuller Plan.  Dr. Fuller Plan, I got test results scanned under Media for you to review at your convenience. Patient understands you are out of the office this week.

## 2022-04-09 NOTE — Telephone Encounter (Signed)
Breat test results: bacterial overgrowth suspected Xifaxan 550 mg po tid x 14d  See Dec 2023 office note. Path showed mild focal peptic duodenitis which occurs with acid, peptic irritation. Peptic duodenitis is a distinctly different disorder than celiac disease - they are not related to each other. The pathology features of celiac disease were NOT identified. Eating gluten prior to her EGD would have brought out pathology features of celiac disease if she had celiac disease. We have NOT found evidence of celiac disease. Famotidine 40 mg po qd for 8 weeks should improve her mild duodenitis.

## 2022-04-13 MED ORDER — RIFAXIMIN 550 MG PO TABS: 550.0000 mg | ORAL_TABLET | Freq: Three times a day (TID) | ORAL | 0 refills | Status: AC

## 2022-04-13 MED ORDER — FAMOTIDINE 40 MG PO TABS: 40.0000 mg | ORAL_TABLET | Freq: Every day | ORAL | 1 refills | Status: AC

## 2022-04-13 NOTE — Addendum Note (Signed)
Addended by: [place], Valyn Latchford E on: 04/13/2022 10:23 AM   Modules accepted: Orders

## 2022-04-13 NOTE — Telephone Encounter (Signed)
I spoke to [facility] and informed her of what Dr Fuller Plan had said and confirmed pharmacy to send in the medicine to. Rx's sent in to [facility].

## 2022-04-17 ENCOUNTER — Other Ambulatory Visit: Payer: Self-pay | Admitting: Obstetrics and Gynecology

## 2022-04-17 DIAGNOSIS — Z1231 Encounter for screening mammogram for malignant neoplasm of breast: Secondary | ICD-10-CM

## 2022-04-22 ENCOUNTER — Other Ambulatory Visit: Payer: Self-pay | Admitting: Obstetrics and Gynecology

## 2022-04-22 DIAGNOSIS — N644 Mastodynia: Secondary | ICD-10-CM

## 2022-05-05 ENCOUNTER — Ambulatory Visit
Admission: RE | Admit: 2022-05-05 | Discharge: 2022-05-05 | Disposition: A | Discharge status: Home / Self Care | Payer: Federal, State, Local not specified - PPO | Source: Ambulatory Visit | Attending: Obstetrics and Gynecology | Admitting: Obstetrics and Gynecology

## 2022-05-05 DIAGNOSIS — N644 Mastodynia: Secondary | ICD-10-CM

## 2022-06-15 DIAGNOSIS — M79645 Pain in left finger(s): Secondary | ICD-10-CM | POA: Diagnosis not present

## 2022-07-21 DIAGNOSIS — F410 Panic disorder [episodic paroxysmal anxiety] without agoraphobia: Secondary | ICD-10-CM | POA: Diagnosis not present

## 2022-08-18 DIAGNOSIS — F410 Panic disorder [episodic paroxysmal anxiety] without agoraphobia: Secondary | ICD-10-CM | POA: Diagnosis not present

## 2022-10-19 DIAGNOSIS — H0289 Other specified disorders of eyelid: Secondary | ICD-10-CM | POA: Diagnosis not present

## 2022-10-26 DIAGNOSIS — N644 Mastodynia: Secondary | ICD-10-CM | POA: Diagnosis not present

## 2022-10-26 DIAGNOSIS — Z01419 Encounter for gynecological examination (general) (routine) without abnormal findings: Secondary | ICD-10-CM | POA: Diagnosis not present

## 2022-11-03 DIAGNOSIS — N939 Abnormal uterine and vaginal bleeding, unspecified: Secondary | ICD-10-CM | POA: Diagnosis not present

## 2022-11-23 DIAGNOSIS — F410 Panic disorder [episodic paroxysmal anxiety] without agoraphobia: Secondary | ICD-10-CM | POA: Diagnosis not present

## 2022-12-01 DIAGNOSIS — F410 Panic disorder [episodic paroxysmal anxiety] without agoraphobia: Secondary | ICD-10-CM | POA: Diagnosis not present

## 2023-01-28 DIAGNOSIS — H0289 Other specified disorders of eyelid: Secondary | ICD-10-CM | POA: Diagnosis not present

## 2023-01-28 DIAGNOSIS — H02413 Mechanical ptosis of bilateral eyelids: Secondary | ICD-10-CM | POA: Diagnosis not present

## 2023-02-15 DIAGNOSIS — L853 Xerosis cutis: Secondary | ICD-10-CM | POA: Diagnosis not present

## 2023-02-15 DIAGNOSIS — D225 Melanocytic nevi of trunk: Secondary | ICD-10-CM | POA: Diagnosis not present

## 2023-02-15 DIAGNOSIS — L308 Other specified dermatitis: Secondary | ICD-10-CM | POA: Diagnosis not present

## 2023-02-15 DIAGNOSIS — D2262 Melanocytic nevi of left upper limb, including shoulder: Secondary | ICD-10-CM | POA: Diagnosis not present

## 2023-03-29 DIAGNOSIS — H2512 Age-related nuclear cataract, left eye: Secondary | ICD-10-CM | POA: Diagnosis not present

## 2023-03-29 DIAGNOSIS — D3132 Benign neoplasm of left choroid: Secondary | ICD-10-CM | POA: Diagnosis not present

## 2023-03-29 DIAGNOSIS — H2511 Age-related nuclear cataract, right eye: Secondary | ICD-10-CM | POA: Diagnosis not present

## 2023-08-05 DIAGNOSIS — M5459 Other low back pain: Secondary | ICD-10-CM | POA: Diagnosis not present
